# Patient Record
Sex: Female | Born: 1980 | Race: Black or African American | Hispanic: No | Marital: Single | Smoking: Former smoker
Health system: Southern US, Community
[De-identification: ages and names within clinical notes are randomized; demographics above are authoritative.]

---

## 2002-12-20 DIAGNOSIS — F419 Anxiety disorder, unspecified: Secondary | ICD-10-CM

## 2002-12-20 HISTORY — DX: Anxiety disorder, unspecified: F41.9

## 2003-12-21 HISTORY — PX: HERNIA REPAIR: SHX51

## 2007-12-21 DIAGNOSIS — K219 Gastro-esophageal reflux disease without esophagitis: Secondary | ICD-10-CM

## 2007-12-21 HISTORY — DX: Gastro-esophageal reflux disease without esophagitis: K21.9

## 2011-12-21 DIAGNOSIS — J45909 Unspecified asthma, uncomplicated: Secondary | ICD-10-CM

## 2011-12-21 HISTORY — DX: Unspecified asthma, uncomplicated: J45.909

## 2018-06-19 DIAGNOSIS — R079 Chest pain, unspecified: Secondary | ICD-10-CM | POA: Diagnosis not present

## 2018-06-19 DIAGNOSIS — R300 Dysuria: Secondary | ICD-10-CM | POA: Diagnosis not present

## 2018-06-19 DIAGNOSIS — R109 Unspecified abdominal pain: Secondary | ICD-10-CM | POA: Diagnosis not present

## 2018-06-19 DIAGNOSIS — M5489 Other dorsalgia: Secondary | ICD-10-CM | POA: Diagnosis not present

## 2018-06-19 DIAGNOSIS — R1084 Generalized abdominal pain: Secondary | ICD-10-CM | POA: Diagnosis not present

## 2018-06-20 DIAGNOSIS — R300 Dysuria: Secondary | ICD-10-CM | POA: Diagnosis not present

## 2018-06-20 DIAGNOSIS — M5489 Other dorsalgia: Secondary | ICD-10-CM | POA: Diagnosis not present

## 2018-06-20 DIAGNOSIS — R109 Unspecified abdominal pain: Secondary | ICD-10-CM | POA: Diagnosis not present

## 2018-06-20 DIAGNOSIS — R1084 Generalized abdominal pain: Secondary | ICD-10-CM | POA: Diagnosis not present

## 2018-11-10 DIAGNOSIS — J45901 Unspecified asthma with (acute) exacerbation: Secondary | ICD-10-CM | POA: Diagnosis not present

## 2018-11-10 DIAGNOSIS — J020 Streptococcal pharyngitis: Secondary | ICD-10-CM | POA: Diagnosis not present

## 2018-12-07 DIAGNOSIS — Z0000 Encounter for general adult medical examination without abnormal findings: Secondary | ICD-10-CM | POA: Diagnosis not present

## 2018-12-07 DIAGNOSIS — Z113 Encounter for screening for infections with a predominantly sexual mode of transmission: Secondary | ICD-10-CM | POA: Diagnosis not present

## 2018-12-07 DIAGNOSIS — Z13220 Encounter for screening for lipoid disorders: Secondary | ICD-10-CM | POA: Diagnosis not present

## 2018-12-07 DIAGNOSIS — Z114 Encounter for screening for human immunodeficiency virus [HIV]: Secondary | ICD-10-CM | POA: Diagnosis not present

## 2018-12-07 DIAGNOSIS — Z131 Encounter for screening for diabetes mellitus: Secondary | ICD-10-CM | POA: Diagnosis not present

## 2018-12-07 DIAGNOSIS — J45909 Unspecified asthma, uncomplicated: Secondary | ICD-10-CM | POA: Diagnosis not present

## 2018-12-07 DIAGNOSIS — Z1329 Encounter for screening for other suspected endocrine disorder: Secondary | ICD-10-CM | POA: Diagnosis not present

## 2018-12-07 DIAGNOSIS — R5383 Other fatigue: Secondary | ICD-10-CM | POA: Diagnosis not present

## 2018-12-20 DIAGNOSIS — I10 Essential (primary) hypertension: Secondary | ICD-10-CM

## 2018-12-20 DIAGNOSIS — E7800 Pure hypercholesterolemia, unspecified: Secondary | ICD-10-CM

## 2018-12-20 HISTORY — DX: Essential (primary) hypertension: I10

## 2018-12-20 HISTORY — DX: Pure hypercholesterolemia, unspecified: E78.00

## 2019-01-24 DIAGNOSIS — Z01419 Encounter for gynecological examination (general) (routine) without abnormal findings: Secondary | ICD-10-CM | POA: Diagnosis not present

## 2019-01-24 DIAGNOSIS — Z113 Encounter for screening for infections with a predominantly sexual mode of transmission: Secondary | ICD-10-CM | POA: Diagnosis not present

## 2019-02-26 DIAGNOSIS — J209 Acute bronchitis, unspecified: Secondary | ICD-10-CM | POA: Diagnosis not present

## 2019-02-26 DIAGNOSIS — Z79899 Other long term (current) drug therapy: Secondary | ICD-10-CM | POA: Diagnosis not present

## 2019-02-26 DIAGNOSIS — R05 Cough: Secondary | ICD-10-CM | POA: Diagnosis not present

## 2019-02-26 DIAGNOSIS — J45909 Unspecified asthma, uncomplicated: Secondary | ICD-10-CM | POA: Diagnosis not present

## 2019-05-18 DIAGNOSIS — B373 Candidiasis of vulva and vagina: Secondary | ICD-10-CM | POA: Diagnosis not present

## 2019-05-18 DIAGNOSIS — Z113 Encounter for screening for infections with a predominantly sexual mode of transmission: Secondary | ICD-10-CM | POA: Diagnosis not present

## 2019-05-18 DIAGNOSIS — N760 Acute vaginitis: Secondary | ICD-10-CM | POA: Diagnosis not present

## 2019-05-29 DIAGNOSIS — H52209 Unspecified astigmatism, unspecified eye: Secondary | ICD-10-CM | POA: Diagnosis not present

## 2019-05-29 DIAGNOSIS — H5213 Myopia, bilateral: Secondary | ICD-10-CM | POA: Diagnosis not present

## 2019-06-13 DIAGNOSIS — H52209 Unspecified astigmatism, unspecified eye: Secondary | ICD-10-CM | POA: Diagnosis not present

## 2019-07-06 DIAGNOSIS — Z111 Encounter for screening for respiratory tuberculosis: Secondary | ICD-10-CM | POA: Diagnosis not present

## 2019-08-13 DIAGNOSIS — J45901 Unspecified asthma with (acute) exacerbation: Secondary | ICD-10-CM | POA: Diagnosis not present

## 2019-08-13 DIAGNOSIS — R509 Fever, unspecified: Secondary | ICD-10-CM | POA: Diagnosis not present

## 2019-08-17 DIAGNOSIS — R918 Other nonspecific abnormal finding of lung field: Secondary | ICD-10-CM | POA: Diagnosis not present

## 2019-08-17 DIAGNOSIS — R197 Diarrhea, unspecified: Secondary | ICD-10-CM | POA: Diagnosis not present

## 2019-08-17 DIAGNOSIS — B349 Viral infection, unspecified: Secondary | ICD-10-CM | POA: Diagnosis not present

## 2019-08-17 DIAGNOSIS — R1110 Vomiting, unspecified: Secondary | ICD-10-CM | POA: Diagnosis not present

## 2019-08-17 DIAGNOSIS — R42 Dizziness and giddiness: Secondary | ICD-10-CM | POA: Diagnosis not present

## 2019-08-17 DIAGNOSIS — R531 Weakness: Secondary | ICD-10-CM | POA: Diagnosis not present

## 2019-08-17 DIAGNOSIS — Z5329 Procedure and treatment not carried out because of patient's decision for other reasons: Secondary | ICD-10-CM | POA: Diagnosis not present

## 2019-08-17 DIAGNOSIS — Z20828 Contact with and (suspected) exposure to other viral communicable diseases: Secondary | ICD-10-CM | POA: Diagnosis not present

## 2019-08-17 DIAGNOSIS — U071 COVID-19: Secondary | ICD-10-CM | POA: Diagnosis not present

## 2019-08-17 DIAGNOSIS — R05 Cough: Secondary | ICD-10-CM | POA: Diagnosis not present

## 2019-12-06 DIAGNOSIS — Z23 Encounter for immunization: Secondary | ICD-10-CM | POA: Diagnosis not present

## 2019-12-06 DIAGNOSIS — E785 Hyperlipidemia, unspecified: Secondary | ICD-10-CM | POA: Diagnosis not present

## 2019-12-06 DIAGNOSIS — Z131 Encounter for screening for diabetes mellitus: Secondary | ICD-10-CM | POA: Diagnosis not present

## 2019-12-06 DIAGNOSIS — Z8619 Personal history of other infectious and parasitic diseases: Secondary | ICD-10-CM | POA: Diagnosis not present

## 2019-12-06 DIAGNOSIS — Z8639 Personal history of other endocrine, nutritional and metabolic disease: Secondary | ICD-10-CM | POA: Diagnosis not present

## 2019-12-06 DIAGNOSIS — Z9189 Other specified personal risk factors, not elsewhere classified: Secondary | ICD-10-CM | POA: Diagnosis not present

## 2019-12-06 DIAGNOSIS — D649 Anemia, unspecified: Secondary | ICD-10-CM | POA: Diagnosis not present

## 2019-12-06 DIAGNOSIS — R300 Dysuria: Secondary | ICD-10-CM | POA: Diagnosis not present

## 2019-12-06 DIAGNOSIS — Z13220 Encounter for screening for lipoid disorders: Secondary | ICD-10-CM | POA: Diagnosis not present

## 2020-07-02 ENCOUNTER — Ambulatory Visit: Payer: Self-pay | Admitting: Family Medicine

## 2020-07-15 NOTE — Progress Notes (Signed)
New Patient Visit Subjective:  CC: Establish care  HPI Tamara Mccarthy is a 39 y.o. female who presents today to establish care. We reviewed her active problems as below.   Vaginal discharge Patient denies any dysuria.  Reports vaginal discharge and odor.  Not currently sexually active.  No known medication allergies.  Asthma  Does not use inhaler daily or weekly. Usually worse in the spring with pollen or changes in season/weather. Well controlled.   Anemia  No dizziness, tachycardia, syncope.   Vit D Def Took supplementation as prescribed from her doctor but never followed up   Hyperlipidemia  Was rx'ed rosuvastatin but stopped taking it and wanted to try lifestyle changes   HSV  Having monthly breakouts around the time of cycle. Was previously on PRN therapy.   HISTORY Reviewed with patient and updated in EMR as appropriate.  No Known Allergies  Current Outpatient Medications:  .  albuterol (VENTOLIN HFA) 108 (90 Base) MCG/ACT inhaler, Inhale into the lungs., Disp: , Rfl:  .  ergocalciferol (VITAMIN D2) 1.25 MG (50000 UT) capsule, Take by mouth., Disp: , Rfl:  .  rosuvastatin (CRESTOR) 5 MG tablet, Take by mouth., Disp: , Rfl:  .  fluticasone (FLONASE) 50 MCG/ACT nasal spray, Place 1 spray into both nostrils daily., Disp: , Rfl:  .  metroNIDAZOLE (FLAGYL) 500 MG tablet, Take 1 tablet (500 mg total) by mouth 2 (two) times daily for 7 days., Disp: 14 tablet, Rfl: 0 .  valACYclovir (VALTREX) 500 MG tablet, Take 1 tablet (500 mg total) by mouth daily., Disp: 90 tablet, Rfl: 3  Past Medical History:  Diagnosis Date  . Acid reflux 2009  . Anxiety 2004  . Asthma 2013  . High cholesterol 2020  . Hypertension 2020   Past Surgical History:  Procedure Laterality Date  . CESAREAN SECTION  2002  . CESAREAN SECTION  2008  . CESAREAN SECTION  2011  . CESAREAN SECTION  2013  . HERNIA REPAIR  [place], [place]   OB/Gyn History:   Periods regular with heavy bleeding,  for about 7 days with 2/7 days with heavy bleeding.  LMP: Patient's last menstrual period was 07/03/2020 (approximate). 4 cesarean section. S/p tubal ligation.   Health Maintenance:  Pap smear today STD screening today  Hep C   Eye doctor: has an appointment next month.  Dentist: Does not have one here yet.    Family History Tamara Mccarthy's family history includes Alcohol abuse in her father and mother; Depression in her father, maternal grandfather, maternal grandmother, and mother; Drug abuse in her father and mother; Hypertension in her mother.   Social History  Tamara Mccarthy's  reports that she quit smoking about 17 years ago. Her smoking use included cigarettes and e-cigarettes. She has never used smokeless tobacco. She reports that she does not drink alcohol and does not use drugs..  Social History   Social History Narrative   Hobbies: writing poems, cooking, braiding hair, going to [employer] and the movies.Lives with three boys (12, 9, 8). Also has a daughter. Not working right now.  Moved to [place] for a change from [place], [place]     Objective:  Physical Exam:  BP 112/78   Pulse 95   Ht 5' 4.5" (1.638 m)   Wt (!) 240 lb 9.6 oz (109.1 kg)   LMP 07/03/2020 (Approximate)   SpO2 99%   BMI 40.66 kg/m   Gen: NAD, alert, non-toxic, pleasant Skin: No obvious rashes, lesions, or trauma.  Normal turgor.  MSK: Normal ROM. Normal strength and tone.  Neuro: Cranial nerves II through VI grossly intact. Gait normal.  Alert and oriented x4.  No obvious abnormal movements. Psych: Cooperative with exam.  Normal speech. Pleasant. Makes good eye contact. Genitourinary: GU: External vulva and vagina nonerythematous, without any obvious lesions or rash.  no abnormal discharge appreciated.Normal ruggae of vaginal walls.  Cervix is non erythematous and non-friable.  There is no cervical motion tenderness, masses or gross abnormalities appreciated during bimanual exam.    Assessment & Plan:  Vitamin D  deficiency Checking levels today  Anemia Patient reports heavy periods.  Recent iron panel with low iron.  Will check ferritin today and start iron therapy as needed.  Otherwise asymptomatic.  Herpes simplex virus (HSV) infection Recently recurrent monthly outbreaks.  Will start patient on prophylactic therapy.  500 mg once daily.  Asthma Well controlled. Continue to monitor   BMI 40.0-44.9, adult (HCC) Previously dx'ed with HLD and HTN. No HTN today. Will obtain lipid panel   Vaginal discharge Vaginal discharge with odor today.  Obtain wet prep.  Positive for bacterial vaginosis.  Sending metronidazole to the pharmacy.  Will call patient with result  Follow up:  Future Appointments  Date Time Provider Department Center  08/08/2020  9:50 AM Melene Plan, MD [facility]    Melene Plan, MD 07/16/20, 2:55 PM

## 2020-07-16 ENCOUNTER — Other Ambulatory Visit (HOSPITAL_COMMUNITY)
Admission: RE | Admit: 2020-07-16 | Discharge: 2020-07-16 | Disposition: A | Discharge status: Home / Self Care | Payer: Medicaid Other | Source: Ambulatory Visit | Attending: Family Medicine | Admitting: Family Medicine

## 2020-07-16 ENCOUNTER — Ambulatory Visit (INDEPENDENT_AMBULATORY_CARE_PROVIDER_SITE_OTHER): Payer: Medicaid Other | Admitting: Family Medicine

## 2020-07-16 ENCOUNTER — Encounter: Payer: Self-pay | Admitting: Family Medicine

## 2020-07-16 ENCOUNTER — Other Ambulatory Visit: Payer: Self-pay

## 2020-07-16 VITALS — BP 112/78 | HR 95 | Ht 64.5 in | Wt 240.6 lb

## 2020-07-16 DIAGNOSIS — B559 Leishmaniasis, unspecified: Secondary | ICD-10-CM | POA: Diagnosis not present

## 2020-07-16 DIAGNOSIS — Z0000 Encounter for general adult medical examination without abnormal findings: Secondary | ICD-10-CM | POA: Diagnosis not present

## 2020-07-16 DIAGNOSIS — N898 Other specified noninflammatory disorders of vagina: Secondary | ICD-10-CM

## 2020-07-16 DIAGNOSIS — Z6841 Body Mass Index (BMI) 40.0 and over, adult: Secondary | ICD-10-CM

## 2020-07-16 DIAGNOSIS — Z01419 Encounter for gynecological examination (general) (routine) without abnormal findings: Secondary | ICD-10-CM | POA: Insufficient documentation

## 2020-07-16 DIAGNOSIS — B009 Herpesviral infection, unspecified: Secondary | ICD-10-CM | POA: Diagnosis not present

## 2020-07-16 DIAGNOSIS — J45909 Unspecified asthma, uncomplicated: Secondary | ICD-10-CM | POA: Insufficient documentation

## 2020-07-16 DIAGNOSIS — J4520 Mild intermittent asthma, uncomplicated: Secondary | ICD-10-CM | POA: Diagnosis not present

## 2020-07-16 DIAGNOSIS — D649 Anemia, unspecified: Secondary | ICD-10-CM

## 2020-07-16 DIAGNOSIS — E559 Vitamin D deficiency, unspecified: Secondary | ICD-10-CM

## 2020-07-16 HISTORY — DX: Body Mass Index (BMI) 40.0 and over, adult: Z684

## 2020-07-16 LAB — POCT WET PREP (WET MOUNT)
Clue Cells Wet Prep Whiff POC: POSITIVE
Trichomonas Wet Prep HPF POC: ABSENT

## 2020-07-16 MED ORDER — VALACYCLOVIR HCL 500 MG PO TABS: 500.0000 mg | ORAL_TABLET | Freq: Every day | ORAL | 3 refills | Status: AC

## 2020-07-16 MED ORDER — METRONIDAZOLE 500 MG PO TABS: 500.0000 mg | ORAL_TABLET | Freq: Two times a day (BID) | ORAL | 0 refills | Status: AC

## 2020-07-16 NOTE — Assessment & Plan Note (Signed)
Previously dx'ed with HLD and HTN. No HTN today. Will obtain lipid panel

## 2020-07-16 NOTE — Assessment & Plan Note (Signed)
Well-controlled. Continue to monitor. 

## 2020-07-16 NOTE — Assessment & Plan Note (Signed)
Patient reports heavy periods.  Recent iron panel with low iron.  Will check ferritin today and start iron therapy as needed.  Otherwise asymptomatic.

## 2020-07-16 NOTE — Assessment & Plan Note (Signed)
Recently recurrent monthly outbreaks.  Will start patient on prophylactic therapy.  500 mg once daily.

## 2020-07-16 NOTE — Assessment & Plan Note (Addendum)
Vaginal discharge with odor today.  Obtain wet prep.  Positive for bacterial vaginosis.  Sending metronidazole to the pharmacy.  Will call patient with result

## 2020-07-16 NOTE — Assessment & Plan Note (Signed)
Checking levels today

## 2020-07-16 NOTE — Patient Instructions (Signed)
It was really nice to meet you! I look forward to taking care of you. I will call you with results. Please sign up for MyChart. Lets plan on making a follow up (at your earliest convenience) visit to review your labs and other health maintenance issues.

## 2020-07-17 LAB — CYTOLOGY - PAP
Chlamydia: NEGATIVE
Comment: NEGATIVE
Comment: NEGATIVE
Comment: NORMAL
Diagnosis: NEGATIVE
High risk HPV: NEGATIVE
Neisseria Gonorrhea: NEGATIVE

## 2020-07-17 LAB — CBC
Hematocrit: 36.5 % (ref 34.0–46.6)
Hemoglobin: 11.3 g/dL (ref 11.1–15.9)
MCH: 23.6 pg — ABNORMAL LOW (ref 26.6–33.0)
MCHC: 31 g/dL — ABNORMAL LOW (ref 31.5–35.7)
MCV: 76 fL — ABNORMAL LOW (ref 79–97)
Platelets: 458 10*3/uL — ABNORMAL HIGH (ref 150–450)
RBC: 4.79 x10E6/uL (ref 3.77–5.28)
RDW: 14.6 % (ref 11.7–15.4)
WBC: 6.8 10*3/uL (ref 3.4–10.8)

## 2020-07-17 LAB — LIPID PANEL
Chol/HDL Ratio: 4.3 ratio (ref 0.0–4.4)
Cholesterol, Total: 201 mg/dL — ABNORMAL HIGH (ref 100–199)
HDL: 47 mg/dL (ref >39)
LDL Chol Calc (NIH): 118 mg/dL — ABNORMAL HIGH (ref 0–99)
Triglycerides: 208 mg/dL — ABNORMAL HIGH (ref 0–149)
VLDL Cholesterol Cal: 36 mg/dL (ref 5–40)

## 2020-07-17 LAB — VITAMIN D 25 HYDROXY (VIT D DEFICIENCY, FRACTURES): Vit D, 25-Hydroxy: 15.5 ng/mL — ABNORMAL LOW (ref 30.0–100.0)

## 2020-07-17 LAB — HEPATITIS C ANTIBODY: Hep C Virus Ab: <0.1 s/co ratio (ref 0.0–0.9)

## 2020-07-17 LAB — FERRITIN: Ferritin: 8 ng/mL — ABNORMAL LOW (ref 15–150)

## 2020-07-18 ENCOUNTER — Ambulatory Visit: Payer: Self-pay | Admitting: Family Medicine

## 2020-08-08 ENCOUNTER — Ambulatory Visit: Payer: Medicaid Other | Admitting: Family Medicine

## 2020-08-08 ENCOUNTER — Encounter: Payer: Self-pay | Admitting: Family Medicine

## 2020-08-08 ENCOUNTER — Other Ambulatory Visit: Payer: Self-pay

## 2020-08-08 VITALS — BP 118/78 | HR 97 | Ht 65.0 in | Wt 238.4 lb

## 2020-08-08 DIAGNOSIS — R251 Tremor, unspecified: Secondary | ICD-10-CM | POA: Diagnosis not present

## 2020-08-08 DIAGNOSIS — Z23 Encounter for immunization: Secondary | ICD-10-CM | POA: Diagnosis not present

## 2020-08-08 DIAGNOSIS — Z114 Encounter for screening for human immunodeficiency virus [HIV]: Secondary | ICD-10-CM

## 2020-08-08 DIAGNOSIS — F419 Anxiety disorder, unspecified: Secondary | ICD-10-CM | POA: Diagnosis not present

## 2020-08-08 DIAGNOSIS — N898 Other specified noninflammatory disorders of vagina: Secondary | ICD-10-CM

## 2020-08-08 MED ORDER — CLINDAMYCIN PHOSPHATE 100 MG VA SUPP: 100.0000 mg | Freq: Every day | VAGINAL | 0 refills | Status: DC

## 2020-08-08 MED ORDER — FLUOXETINE HCL 20 MG PO CAPS
ORAL_CAPSULE | ORAL | 0 refills | Status: DC
Start: 2020-08-08 — End: 2020-09-04

## 2020-08-08 NOTE — Progress Notes (Signed)
    SUBJECTIVE:   CHIEF COMPLAINT / HPI:   Shaking/Tremors Anxiety Hx of anxiety from 2004-2007. She noticed jerking in her body when driving and a week ago noticed jerking in her sleep. She reports generalized shaking of her whole body. She does not lose consciousness. Sometimes when she feels like that when sleeping, its hard to wake up. It is occurring most nights a week. This past weekend, was with out with family and was trying to "hold it" to hide it. She reports that this did not have with panic attacks and anxiety previously. With sleep, the shaking occurs in the middle of the night which wakes her up. Episodes have been the same amount of time, seconds to a minute. When it is done, she feels tingling in her hands and arm. She can feel it coming on. Does not feel worried during these times when episodes occur. Feels anxiety with news. Currently living in a shelter.  Gad 7 - 5 PHQ9 - 0   Metronidazole Rash  Patient diagnosed with BV at last visit on 07/16/20. Patient reports rash with abx. Requesting different medication. Continues to be symptomatic.   Covid Vx  Patient requesting covid vx today.   PERTINENT  PMH / PSH: BMI, HSV, anemia, BMI 40  OBJECTIVE:   BP 118/78   Pulse 97   Ht 5\' 5"  (1.651 m)   Wt 238 lb 6.4 oz (108.1 kg)   SpO2 97%   BMI 39.67 kg/m   General: Well-appearing female, no acute distress. Neuro: No focal neurologic deficits.  Cranial nerves intact.  No tremors or shaking during exam today.  ASSESSMENT/PLAN:   Episode of shaking Unclear etiology of shaking.  May be due to anxiety.  No focal neurologic deficits on exam today and seems more so voluntary as patient can control shaking episodes.  We will continue to monitor and have recommended that patient start symptom journal to help with diagnosis.  Patient does report anxiety from her current living situation and from Covid.  Anxiety Patient's anxiety increased with COVID-19 and currently living in a  shelter.  Her GAD-7 today is 5.  She would like to be started on a medication for antianxiety.  We will start Prozac 20 mg and increase to 40 mg after 1 week.  Patient to follow-up in 1 to 2 weeks for follow-up.  Also provided patient list of resources for counseling and therapy.  Vaginal discharge Patient has had continued vaginal discharge and symptoms since 07/16/2020.  She reports metronidazole caused her to have a rash.  We will send clindamycin suppositories to the pharmacy.     07/18/2020, MD [facility]

## 2020-08-08 NOTE — Patient Instructions (Addendum)
Anxiety Please see the list of contacts below for counseling services. We will also start a medication called Prozac (aka fluoxetine). Start with 20 mg for one week and then increase to 40 mg. Come back in 1-2 weeks to follow up for anxiety and for shaking episodes. Please make a journal of the shaking episodes. Please go to the emergency department if you experience any loss of consciousness, slurred speech, weakness during the episodes.   Please use the clindamycin suppositories for three days for BV infection   Therapy and Counseling Resources Most providers on this list will take Medicaid. Patients with commercial insurance or Medicare should contact their insurance company to get a list of in network providers.  [facility]  [address] [place], [place] [address]      [phone]  [facility] [address]  [place], [place] [address] [phone]  [facility] [address]  [place], [place] [address]       [phone]      Jovita Kussmaul [facility] [address], [place], [place] [phone]  [facility]:  [address] [place] [phone]  [facility]:  [address] [place] [place] [phone]  [facility] (under & uninsured) [address]   [place] [phone]    [email or web address]    [facility] [address] . [place]    [phone]  [facility] -[address]     Phone:  [phone]     [facility]-  [address]  [phone]   [facility] [address]      [place], [place] [phone] ext [phone]  [facility]: [address] [place], [place], [place]  [phone]   [facility] (Spanish therapist) [address] [place]  [address]   [place] [address]    [phone]    [facility]   [address],  [place], [place]  [phone]   [facility]  [address] [place]  [phone]  [facility]  [address] [place], [place]        [phone]  Open Access/Walk In Clinic under & uninsured  [facility]  [address] [place], [place] [phone] Crisis [phone]  [facility] [place],  (Spanish)   [address], [place]: [phone]) 8:30 - 12; 1 - 2:30  [facility] [employer],  [address] [place], [place]    ([phone]):8:30 - 12; 2 - 3PM  [facility] [employer],  [address]  [place]; [phone]):   Mon - Fri 8 AM - 5 PM  [facility] [address] [place]  MWF 12:30 to 3:00 or call to schedule an appointment  [phone]  Specific Provider options Psychology Today  [email or web address] 1. click on find a therapist  2. enter your zip code 3. left side and select or tailor a therapist for your specific need.   [facility] Provider Directory [email or web address]  (Medicaid)   Follow all drop down to find a provider  Social Support program [facility] [place] [phone] or [employer] [address], [place], [place] Recovery support and educational   24- Hour Availability:  .  Marland Kitchen [facility]  . [address] [place], [place] [phone] Crisis [phone]  . [facility] [phone]  [facility] Crisis Service  [phone]   . [facility] [employer]  [phone] (after hours)  . Therapeutic Alternative/Mobile Crisis   [phone]  . [place] National Suicide Hotline  850-483-9008 (TALK)  . Call 911 or go to emergency room  . [employer]  [phone]);  [place] and [employer]   . [facility]  [phone]); [place], [place],  [place], [place], [place], [place], [place]

## 2020-08-09 LAB — BASIC METABOLIC PANEL
BUN/Creatinine Ratio: 11 (ref 9–23)
BUN: 8 mg/dL (ref 6–20)
CO2: 23 mmol/L (ref 20–29)
Calcium: 9 mg/dL (ref 8.7–10.2)
Chloride: 102 mmol/L (ref 96–106)
Creatinine, Ser: 0.71 mg/dL (ref 0.57–1.00)
GFR calc Af Amer: 125 mL/min/{1.73_m2} (ref >59)
GFR calc non Af Amer: 108 mL/min/{1.73_m2} (ref >59)
Glucose: 80 mg/dL (ref 65–99)
Potassium: 3.9 mmol/L (ref 3.5–5.2)
Sodium: 137 mmol/L (ref 134–144)

## 2020-08-09 LAB — HIV ANTIBODY (ROUTINE TESTING W REFLEX): HIV Screen 4th Generation wRfx: NONREACTIVE

## 2020-08-11 DIAGNOSIS — Z23 Encounter for immunization: Secondary | ICD-10-CM | POA: Insufficient documentation

## 2020-08-11 DIAGNOSIS — F419 Anxiety disorder, unspecified: Secondary | ICD-10-CM | POA: Insufficient documentation

## 2020-08-11 DIAGNOSIS — R251 Tremor, unspecified: Secondary | ICD-10-CM | POA: Insufficient documentation

## 2020-08-11 NOTE — Assessment & Plan Note (Signed)
Patient has had continued vaginal discharge and symptoms since 07/16/2020.  She reports metronidazole caused her to have a rash.  We will send clindamycin suppositories to the pharmacy.

## 2020-08-11 NOTE — Assessment & Plan Note (Addendum)
Unclear etiology of shaking.  May be due to anxiety.  No focal neurologic deficits on exam today and seems more so voluntary as patient can control shaking episodes.  We will continue to monitor and have recommended that patient start symptom journal to help with diagnosis.  Patient does report anxiety from her current living situation and from Covid. Also checked BMP which was normal.

## 2020-08-11 NOTE — Assessment & Plan Note (Signed)
Patient's anxiety increased with COVID-19 and currently living in a shelter.  Her GAD-7 today is 5.  She would like to be started on a medication for antianxiety.  We will start Prozac 20 mg and increase to 40 mg after 1 week.  Patient to follow-up in 1 to 2 weeks for follow-up.  Also provided patient list of resources for counseling and therapy.

## 2020-08-12 ENCOUNTER — Encounter: Payer: Self-pay | Admitting: Family Medicine

## 2020-08-13 ENCOUNTER — Encounter: Payer: Self-pay | Admitting: Family Medicine

## 2020-08-13 MED ORDER — ERGOCALCIFEROL 1.25 MG (50000 UT) PO CAPS: 50000.0000 [IU] | ORAL_CAPSULE | ORAL | 1 refills | Status: DC

## 2020-08-13 MED ORDER — ROSUVASTATIN CALCIUM 20 MG PO TABS
20.0000 mg | ORAL_TABLET | Freq: Every day | ORAL | 3 refills | Status: DC
Start: 2020-08-13 — End: 2021-07-21

## 2020-08-13 NOTE — Telephone Encounter (Signed)
Spoke to patient over the phone to answer her MyChart question.  Refilled vitamin D 50,000 units once weekly for 8 weeks.  Will recheck in 8 weeks. Also increased rosuvastatin to 20 mg from 5 mg given her elevated cholesterol on lipid panel.  We will recheck this in 3 months.

## 2020-08-18 ENCOUNTER — Encounter: Payer: Self-pay | Admitting: Family Medicine

## 2020-08-18 DIAGNOSIS — N920 Excessive and frequent menstruation with regular cycle: Secondary | ICD-10-CM

## 2020-08-18 NOTE — Telephone Encounter (Signed)
Spoke to patient over the phone who reported that she was having some dizziness and lightheadedness.  At her last visit on 07/16/2020, her hemoglobin was 11.3.  Patient denies any history of transfusions. Discussed reasons to go to the emergency department.  At this time, patient would like to come into the office for a lab visit to get her blood levels checked.  Will schedule patient for lab visit tomorrow at 1:30 and will place order for future point-of-care hemoglobin.  Melene Plan, M.D.  4:46 PM 08/18/2020

## 2020-08-19 ENCOUNTER — Other Ambulatory Visit: Payer: Medicaid Other

## 2020-08-20 ENCOUNTER — Ambulatory Visit (INDEPENDENT_AMBULATORY_CARE_PROVIDER_SITE_OTHER): Payer: Medicaid Other | Admitting: Family Medicine

## 2020-08-20 ENCOUNTER — Other Ambulatory Visit: Payer: Self-pay

## 2020-08-20 ENCOUNTER — Ambulatory Visit (HOSPITAL_COMMUNITY)
Admission: EM | Admit: 2020-08-20 | Discharge: 2020-08-20 | Discharge status: Against Medical Advice | Payer: Medicaid Other

## 2020-08-20 ENCOUNTER — Encounter: Payer: Self-pay | Admitting: Family Medicine

## 2020-08-20 ENCOUNTER — Other Ambulatory Visit (INDEPENDENT_AMBULATORY_CARE_PROVIDER_SITE_OTHER): Payer: Medicaid Other

## 2020-08-20 VITALS — BP 104/70 | HR 95 | Wt 238.0 lb

## 2020-08-20 DIAGNOSIS — N920 Excessive and frequent menstruation with regular cycle: Secondary | ICD-10-CM

## 2020-08-20 DIAGNOSIS — N939 Abnormal uterine and vaginal bleeding, unspecified: Secondary | ICD-10-CM

## 2020-08-20 LAB — POCT HEMOGLOBIN: Hemoglobin: 9.7 g/dL — AB (ref 11–14.6)

## 2020-08-20 LAB — POCT URINE PREGNANCY: Preg Test, Ur: NEGATIVE

## 2020-08-20 MED ORDER — FERUMOXYTOL INJECTION 510 MG/17 ML: 510.0000 mg | Freq: Once | INTRAVENOUS | 0 refills | Status: DC

## 2020-08-20 MED ORDER — MEGESTROL ACETATE 40 MG PO TABS: ORAL_TABLET | ORAL | 0 refills | Status: DC

## 2020-08-20 NOTE — Progress Notes (Signed)
    SUBJECTIVE:   CHIEF COMPLAINT / HPI:   Vaginal bleeding: 2.5 weeks of heavy bleeding with clots.  Feels like her ovaries are "cramping sharp pain ".  Pain is an 8/10.  Normally has regular vaginal bleeding during her cycles with 7 days total in 2 days of heavy bleeding.Marland Kitchen  Has a distant hx of ovarian cysts that were diagnosed when she was still living in [facility] by her OB/GYN. Not on birth control currently since her tubal ligation.  Not currently sexually active since January.  Not currently taking iron.    PERTINENT  PMH / PSH: Anemia  OBJECTIVE:   BP 104/70   Pulse 95   Wt 238 lb (108 kg)   LMP 08/06/2020   SpO2 98%   BMI 39.61 kg/m   General: Alert, oriented. CV: Regular rate and rhythm GI: Mild tenderness in the lower quadrants bilaterally. GU: Significant bleeding in the vaginal vault coming from cervix.  No lesions seen on speculum exam.   ASSESSMENT/PLAN:   Abnormal uterine bleeding (AUB) Two and half weeks of daily vaginal bleeding.  Will prescribe Megace 80 mg three times a day for 3 days followed by 40 mg three times a day for a month.  We will then taper down at that point after she sees [place] again in clinic.  Will get transvaginal ultrasound to look for structural causes.  CBC and point-of-care pregnancy test.  Will order Feraheme.     Sandre Kitty, MD [facility]

## 2020-08-20 NOTE — Patient Instructions (Signed)
It was nice to meet you today,  We did the following things today: -I have started a medicine called Megace.  I want you to take 80 mg 3 times a day for 5 days and then reduce this to 40 mg 3 times a day for 1 month total.  I have written you prescription for enough pills to last you for 1 month. -I would like you to schedule appointment with me in 2 weeks and then another appointment with your PCP, Dr. Selena Batten for 1 month from now approximately. -I have ordered an ultrasound for your uterus to look for fibroids. -I have ordered IV iron which you will get at an infusion center -I have ordered a CBC to confirm the hemoglobin from earlier today.  If you have any difficulty as well taking this medicine, please call our office to let us know before stopping it.  Have a great day,  Frederic Jericho, MD

## 2020-08-21 LAB — CBC
Hematocrit: 34.9 % (ref 34.0–46.6)
Hemoglobin: 10.6 g/dL — ABNORMAL LOW (ref 11.1–15.9)
MCH: 23.3 pg — ABNORMAL LOW (ref 26.6–33.0)
MCHC: 30.4 g/dL — ABNORMAL LOW (ref 31.5–35.7)
MCV: 77 fL — ABNORMAL LOW (ref 79–97)
Platelets: 443 10*3/uL (ref 150–450)
RBC: 4.54 x10E6/uL (ref 3.77–5.28)
RDW: 14.8 % (ref 11.7–15.4)
WBC: 8.6 10*3/uL (ref 3.4–10.8)

## 2020-08-22 ENCOUNTER — Telehealth: Payer: Self-pay | Admitting: Family Medicine

## 2020-08-22 NOTE — Telephone Encounter (Signed)
Patient is calling and would like to know the status of having an order placed for IV iron. I saw in the notes it said it would be sent to an infusion center.   Patient would like for someone to call her to give her information about scheduling with them. I informed her that the infusion center would most likely call to schedule after receiving the order.   Please call to discuss  [phone]

## 2020-08-23 DIAGNOSIS — N939 Abnormal uterine and vaginal bleeding, unspecified: Secondary | ICD-10-CM | POA: Insufficient documentation

## 2020-08-23 NOTE — Assessment & Plan Note (Signed)
Two and half weeks of daily vaginal bleeding.  Will prescribe Megace 80 mg three times a day for 3 days followed by 40 mg three times a day for a month.  We will then taper down at that point after she sees [place] again in clinic.  Will get transvaginal ultrasound to look for structural causes.  CBC and point-of-care pregnancy test.  Will order Feraheme.

## 2020-08-26 ENCOUNTER — Ambulatory Visit
Admission: RE | Admit: 2020-08-26 | Discharge: 2020-08-26 | Disposition: A | Discharge status: Home / Self Care | Payer: Medicaid Other | Source: Ambulatory Visit | Attending: Family Medicine | Admitting: Family Medicine

## 2020-08-26 DIAGNOSIS — N939 Abnormal uterine and vaginal bleeding, unspecified: Secondary | ICD-10-CM

## 2020-08-26 DIAGNOSIS — R9389 Abnormal findings on diagnostic imaging of other specified body structures: Secondary | ICD-10-CM | POA: Diagnosis not present

## 2020-08-26 DIAGNOSIS — N888 Other specified noninflammatory disorders of cervix uteri: Secondary | ICD-10-CM | POA: Diagnosis not present

## 2020-08-28 ENCOUNTER — Encounter: Payer: Self-pay | Admitting: Family Medicine

## 2020-08-29 ENCOUNTER — Ambulatory Visit: Payer: Medicaid Other

## 2020-08-29 ENCOUNTER — Other Ambulatory Visit: Payer: Medicaid Other

## 2020-08-29 ENCOUNTER — Other Ambulatory Visit: Payer: Self-pay

## 2020-08-29 VITALS — HR 94

## 2020-08-29 DIAGNOSIS — R059 Cough, unspecified: Secondary | ICD-10-CM

## 2020-08-29 MED ORDER — PREDNISONE 50 MG PO TABS: 50.0000 mg | ORAL_TABLET | Freq: Every day | ORAL | 0 refills | Status: DC

## 2020-08-29 MED ORDER — ALBUTEROL SULFATE HFA 108 (90 BASE) MCG/ACT IN AERS: 2.0000 | INHALATION_SPRAY | RESPIRATORY_TRACT | 0 refills | Status: DC | PRN

## 2020-08-29 NOTE — Progress Notes (Signed)
Patient presented today for her second COVID vaccine. Upon being roomed by RN for administration of COVID vaccine, patient reported cough and sore throat, and requested breathing treatment. RN contacted me as I was precepting, and under my instruction RN obtained pulse ox (96% on room air) and then asked patient to wait in her car.  I contacted patient by phone to get more history. She reports sore throat, cough, runny nose, and shortness of breath for one week. Denies fevers. Normal PO intake. No loss of taste or smell, no sick contacts. Has history of asthma and does report she has been wheezing. Notes similar symptoms in the past when the seasons change. In past has gotten neb treatments which have helped her symptoms. Does not have inhaler at home.  Patient speaking normally in full sentences throughout the encounter, without any respiratory distress evident.   Given extremely high prevalence of COVID (specifically delta variant) in our community at this time, strongly recommend patient be tested for COVID. Assisted her with scheduling an appointment this afternoon for testing. Advised isolation from others until test results return.  Rx sent in for albuterol inhaler as well as 5 day steroid burst. Counseled on signs/sx that should prompt her needing to go to the ER.   Patient will need to wait until she has tested negative prior to receiving second dose of COVID vaccine.  I inquired with patient whether she was asked about COVID symptoms at the front desk upon checkin, and she confirmed that she had been asked these questions, and had answered "no" despite having these symptoms. When I asked why she did not answer honestly, she stated she was under the impression that since she had COVID back in August of 2020 that she could not have it again. Informed patient that she can indeed get COVID a second time (especially this far out from primary infection), and that honesty in answering screening  questions is critical in order to limit exposure to all of the other patients in our clinic. Will route to Journalist, newspaper so they are aware.

## 2020-09-03 ENCOUNTER — Emergency Department (HOSPITAL_COMMUNITY)
Admission: EM | Admit: 2020-09-03 | Discharge: 2020-09-03 | Disposition: A | Discharge status: Home / Self Care | Payer: Medicaid Other | Attending: Emergency Medicine | Admitting: Emergency Medicine

## 2020-09-03 ENCOUNTER — Other Ambulatory Visit: Payer: Self-pay

## 2020-09-03 ENCOUNTER — Encounter (HOSPITAL_COMMUNITY): Payer: Self-pay

## 2020-09-03 ENCOUNTER — Other Ambulatory Visit: Payer: Self-pay | Admitting: Family Medicine

## 2020-09-03 ENCOUNTER — Telehealth: Payer: Self-pay | Admitting: *Deleted

## 2020-09-03 DIAGNOSIS — Z20822 Contact with and (suspected) exposure to covid-19: Secondary | ICD-10-CM | POA: Insufficient documentation

## 2020-09-03 DIAGNOSIS — B9789 Other viral agents as the cause of diseases classified elsewhere: Secondary | ICD-10-CM

## 2020-09-03 DIAGNOSIS — I10 Essential (primary) hypertension: Secondary | ICD-10-CM | POA: Insufficient documentation

## 2020-09-03 DIAGNOSIS — R4020 Unspecified coma: Secondary | ICD-10-CM | POA: Diagnosis not present

## 2020-09-03 DIAGNOSIS — Z79899 Other long term (current) drug therapy: Secondary | ICD-10-CM | POA: Diagnosis not present

## 2020-09-03 DIAGNOSIS — J45909 Unspecified asthma, uncomplicated: Secondary | ICD-10-CM | POA: Insufficient documentation

## 2020-09-03 DIAGNOSIS — Z7951 Long term (current) use of inhaled steroids: Secondary | ICD-10-CM | POA: Insufficient documentation

## 2020-09-03 DIAGNOSIS — D649 Anemia, unspecified: Secondary | ICD-10-CM | POA: Diagnosis present

## 2020-09-03 DIAGNOSIS — J329 Chronic sinusitis, unspecified: Secondary | ICD-10-CM

## 2020-09-03 DIAGNOSIS — R42 Dizziness and giddiness: Secondary | ICD-10-CM | POA: Diagnosis not present

## 2020-09-03 DIAGNOSIS — Z87891 Personal history of nicotine dependence: Secondary | ICD-10-CM | POA: Insufficient documentation

## 2020-09-03 DIAGNOSIS — H6593 Unspecified nonsuppurative otitis media, bilateral: Secondary | ICD-10-CM | POA: Insufficient documentation

## 2020-09-03 DIAGNOSIS — Z7952 Long term (current) use of systemic steroids: Secondary | ICD-10-CM | POA: Diagnosis not present

## 2020-09-03 DIAGNOSIS — J019 Acute sinusitis, unspecified: Secondary | ICD-10-CM | POA: Insufficient documentation

## 2020-09-03 DIAGNOSIS — R58 Hemorrhage, not elsewhere classified: Secondary | ICD-10-CM | POA: Diagnosis not present

## 2020-09-03 LAB — TYPE AND SCREEN
ABO/RH(D): O POS
Antibody Screen: NEGATIVE

## 2020-09-03 LAB — BASIC METABOLIC PANEL
Anion gap: 9 (ref 5–15)
BUN: 9 mg/dL (ref 6–20)
CO2: 25 mmol/L (ref 22–32)
Calcium: 8.9 mg/dL (ref 8.9–10.3)
Chloride: 104 mmol/L (ref 98–111)
Creatinine, Ser: 0.87 mg/dL (ref 0.44–1.00)
GFR calc Af Amer: >60 mL/min (ref >60)
GFR calc non Af Amer: >60 mL/min (ref >60)
Glucose, Bld: 97 mg/dL (ref 70–99)
Potassium: 3.6 mmol/L (ref 3.5–5.1)
Sodium: 138 mmol/L (ref 135–145)

## 2020-09-03 LAB — CBC
HCT: 39.1 % (ref 36.0–46.0)
Hemoglobin: 11.3 g/dL — ABNORMAL LOW (ref 12.0–15.0)
MCH: 22.9 pg — ABNORMAL LOW (ref 26.0–34.0)
MCHC: 28.9 g/dL — ABNORMAL LOW (ref 30.0–36.0)
MCV: 79.1 fL — ABNORMAL LOW (ref 80.0–100.0)
Platelets: 460 10*3/uL — ABNORMAL HIGH (ref 150–400)
RBC: 4.94 MIL/uL (ref 3.87–5.11)
RDW: 17.2 % — ABNORMAL HIGH (ref 11.5–15.5)
WBC: 12 10*3/uL — ABNORMAL HIGH (ref 4.0–10.5)
nRBC: 0 % (ref 0.0–0.2)

## 2020-09-03 LAB — I-STAT BETA HCG BLOOD, ED (MC, WL, AP ONLY): I-stat hCG, quantitative: <5 m[IU]/mL (ref <5)

## 2020-09-03 LAB — SARS CORONAVIRUS 2 BY RT PCR (HOSPITAL ORDER, PERFORMED IN ~~LOC~~ HOSPITAL LAB): SARS Coronavirus 2: NEGATIVE

## 2020-09-03 MED ORDER — GUAIFENESIN ER 600 MG PO TB12: 600.0000 mg | ORAL_TABLET | Freq: Two times a day (BID) | ORAL | 0 refills | Status: DC | PRN

## 2020-09-03 MED ORDER — FLUTICASONE PROPIONATE 50 MCG/ACT NA SUSP: 2.0000 | Freq: Every day | NASAL | 0 refills | Status: DC

## 2020-09-03 MED ORDER — PSEUDOEPHEDRINE HCL 30 MG PO TABS: 30.0000 mg | ORAL_TABLET | Freq: Four times a day (QID) | ORAL | 0 refills | Status: DC | PRN

## 2020-09-03 NOTE — ED Provider Notes (Signed)
[facility] EMERGENCY DEPARTMENT Provider Note   CSN: [id number] Arrival date & time: 09/03/20  1136  History Chief Complaint  Patient presents with   Dizziness   Anemia   Tamara Mccarthy is a 39 y.o. female.  39 year old female presents with dizziness worsening for the past couple of weeks which she contributes to her anemia.  She has been treated in the last month for a repeat and is not currently having any active bleeding.  Hemoglobin today is 11.3. In addition to her dizziness patient has had clear nasal discharge, bilateral ear fullness and intermittent clear drainage and some intermittent dry coughing and shortness of breath.  She states that the breathing issues resolved with her inhalers.  The dizziness is worse with leaning forward or sitting up from laying position. She does not have any dizziness at baseline without movement or head turning. She is able to ambulate. Endorses frontal sinus pressure. Denies fevers. She has a nasal spray which may have modestly helped.  She was not able to obtain Covid testing per recommendations by attending at her most recent clinic appointment.     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, [place]    [place] History   No obstetric history on  file.    Family History  Problem Relation Age of Onset   Alcohol abuse Mother    Depression Mother    Hypertension Mother    Drug abuse Mother    Alcohol abuse Father    Depression Father    Drug abuse Father    Depression Maternal Grandmother    Depression Maternal Grandfather    Social History   Tobacco Use   Smoking status: Former Smoker    Types: Cigarettes, E-cigarettes    Quit date: 2004    Years since quitting: 17.7   Smokeless tobacco: Never Used  Substance Use Topics   Alcohol use: Never   Drug use: Never    Home Medications Prior to Admission medications   Medication Sig Start Date End Date Taking? Authorizing Provider  albuterol (VENTOLIN HFA) 108 (90 Base) MCG/ACT inhaler Inhale 2 puffs into the lungs every 4 (four) hours as needed for wheezing or shortness of breath. 08/29/20   Latrelle Dodrill, MD  clindamycin (CLEOCIN) 100 MG vaginal suppository Place 1 suppository (100 mg total) vaginally at bedtime. 08/08/20   Melene Plan, MD  ergocalciferol (VITAMIN D2) 1.25 MG (50000 UT) capsule Take 1 capsule (50,000 Units total) by mouth once a week for 8 doses. 08/13/20 10/02/20  Melene Plan, MD  ferumoxytol [facility]) 510 MG/17ML SOLN injection Inject 17 mLs (510 mg total) into the vein once for 1 dose. 08/20/20 08/20/20  Sandre Kitty, MD  FLUoxetine [facility])  20 MG capsule Take 1 capsule (20 mg total) by mouth daily for 7 days, THEN 2 capsules (40 mg total) daily for 21 days. 08/08/20 09/05/20  Melene PlanKim, Rachel E, MD  fluticasone (FLONASE) 50 MCG/ACT nasal spray Place 2 sprays into both nostrils daily. 09/03/20   Taci Sterling L, DO  guaiFENesin (MUCINEX) 600 MG 12 hr tablet Take 1 tablet (600 mg total) by mouth 2 (two) times daily as needed for to loosen phlegm. 09/03/20   Dareen PianoAnderson, Hurschel Paynter L, DO  predniSONE (DELTASONE) 50 MG tablet Take 1 tablet (50 mg total) by mouth daily with breakfast. For 5 days 08/29/20   Latrelle DodrillMcIntyre, Brittany J, MD  pseudoephedrine  (SUDAFED) 30 MG tablet Take 1 tablet (30 mg total) by mouth every 6 (six) hours as needed for congestion. 09/03/20 09/03/21  Othel Dicostanzo L, DO  rosuvastatin (CRESTOR) 20 MG tablet Take 1 tablet (20 mg total) by mouth daily. 08/13/20   Melene PlanKim, Rachel E, MD  valACYclovir (VALTREX) 500 MG tablet Take 1 tablet (500 mg total) by mouth daily. 07/16/20   Melene PlanKim, Rachel E, MD    Allergies    Metronidazole  Review of Systems   Review of Systems  Physical Exam Updated Vital Signs BP (!) 114/91 (BP Location: Right Arm)    Pulse 94    Temp 98.7 F (37.1 C) (Oral)    Resp 18    Ht 5\' 4"  (1.626 m)    Wt 108.9 kg    LMP 08/23/2020    SpO2 98%    BMI 41.20 kg/m   Physical Exam Vitals and nursing note reviewed.  Constitutional:      General: She is not in acute distress.    Appearance: Normal appearance. She is not ill-appearing.  HENT:     Head: Normocephalic.     Right Ear: Ear canal and external ear normal. No drainage, swelling or tenderness. A middle ear effusion is present. There is no impacted cerumen. No mastoid tenderness. Tympanic membrane is not erythematous.     Left Ear: Ear canal and external ear normal. No drainage, swelling or tenderness. A middle ear effusion is present. There is no impacted cerumen. No mastoid tenderness. Tympanic membrane is not erythematous.     Ears:     Comments: dix-hallpike negative    Nose: Nasal tenderness and rhinorrhea present. Rhinorrhea is clear.     Right Turbinates: Swollen.     Left Turbinates: Swollen.     Right Sinus: Maxillary sinus tenderness and frontal sinus tenderness present.     Left Sinus: No maxillary sinus tenderness or frontal sinus tenderness.     Mouth/Throat:     Mouth: Mucous membranes are moist. No oral lesions.     Tongue: No lesions.     Pharynx: Oropharynx is clear. Posterior oropharyngeal erythema present. No oropharyngeal exudate.     Tonsils: No tonsillar exudate or tonsillar abscesses.  Eyes:     Pupils: Pupils are equal,  round, and reactive to light.  Cardiovascular:     Rate and Rhythm: Normal rate and regular rhythm.     Pulses: Normal pulses.     Heart sounds: Normal heart sounds.  Pulmonary:     Effort: Pulmonary effort is normal.     Breath sounds: Normal breath sounds.  Abdominal:     Palpations: Abdomen is soft.  Musculoskeletal:     Cervical back: No tenderness.  Lymphadenopathy:     Cervical: Cervical adenopathy present.  Skin:    General: Skin is warm  and dry.     Capillary Refill: Capillary refill takes less than 2 seconds.  Neurological:     General: No focal deficit present.     Mental Status: She is alert.  Psychiatric:        Mood and Affect: Mood normal.        Behavior: Behavior normal.     ED Results / Procedures / Treatments   Labs (all labs ordered are listed, but only abnormal results are displayed) Labs Reviewed  CBC - Abnormal; Notable for the following components:      Result Value   WBC 12.0 (*)    Hemoglobin 11.3 (*)    MCV 79.1 (*)    MCH 22.9 (*)    MCHC 28.9 (*)    RDW 17.2 (*)    Platelets 460 (*)    All other components within normal limits  SARS CORONAVIRUS 2 BY RT PCR (HOSPITAL ORDER, PERFORMED IN [facility])  BASIC METABOLIC PANEL  I-STAT BETA HCG BLOOD, ED ([facility], [facility], [facility] ONLY)  TYPE AND SCREEN  ABO/RH    EKG EKG Interpretation  Date/Time:  Wednesday September 03 2020 11:42:19 EDT Ventricular Rate:  88 PR Interval:  158 QRS Duration: 82 QT Interval:  346 QTC Calculation: 418 R Axis:   46 Text Interpretation: Normal sinus rhythm Nonspecific T wave abnormality Abnormal ECG Confirmed by Margarita Grizzle [phone]) on 09/03/2020 2:47:18 PM   Radiology No results found.  Procedures Procedures (including critical care time)  Medications Ordered in ED Medications - No data to display  ED Course  I have reviewed the triage vital signs and the nursing notes.  Pertinent labs & imaging results that were available during my care of the  patient were reviewed by me and considered in my medical decision making (see chart for details).   MDM Rules/Calculators/A&P                         Patient having positional dizziness with exacerbation from hot shower this morning which is not present at rest during exam. Can be recreated by having patient sit up or lean forward. Also has tenderness to sinus percussion and bilateral ear effusions on exam. Her symptoms are likely related to viral sinusitis so will treat for sinus congestion. Did not see signs of bacterial infection on exam. Will test for COVID-19 today, especially since patient is resident at shelter and has 3 minor children. She has received first vaccine. Instructed to follow up with PCP if not improving.  Unlikely BPV with negative dix-hallpike. Do not suspect cardiac cause with no chest pain, and normal ECG. Unlikely to be related to anemia with an improved hemoglobin of 11.3. She will still keep her faraheme appointment 9/27.    Final Clinical Impression(s) / ED Diagnoses Final diagnoses:  Bilateral otitis media with effusion  Viral sinusitis   Rx / DC Orders ED Discharge Orders         Ordered    guaiFENesin (MUCINEX) 600 MG 12 hr tablet  2 times daily PRN        09/03/20 1733    fluticasone (FLONASE) 50 MCG/ACT nasal spray  Daily        09/03/20 1733    pseudoephedrine (SUDAFED) 30 MG tablet  Every 6 hours PRN        09/03/20 1733           Leeroy Bock, DO 09/03/20 1748    Jeraldine Loots,  Molly Maduro, MD 09/03/20 [phone]

## 2020-09-03 NOTE — ED Notes (Signed)
C/o heavy menstrual cycle for the past three weeks- seen at [facility] outpatient women[facility]- given "pills to stop bleeding" is supposed to have Iron infusion, but has not had that scheduled yet-  Stays in family shelter, case manager dropped off 3 children.  No bleeding at present

## 2020-09-03 NOTE — Telephone Encounter (Signed)
LVm for pt to call office to let her know that her iron infusion is scheduled on 09/15/2020 @ 10:00am at [place].  She should enter in at main entrance and inform them she is there for iron infusion and they will instruct her where to go. Will also send a mychart message with appointment time as well. Satchel Heidinger Zimmerman Rumple, CMA

## 2020-09-03 NOTE — ED Triage Notes (Signed)
Pt reports dizziness that has been going on for about a month when she had a heavy menstrual cycle for about 3 weeks in aug. But the dizziness got worse today when she got out of the shower. Pt is supposed to have an iron infusion done but states she has not heard from anyone to get it set up. Pt denies any vaginal bleeding at this time, states she was given a medication that stopped it. Pt a.o, dizziness worse with ambulation.

## 2020-09-03 NOTE — Discharge Instructions (Addendum)
Your hgb has improved to 11.3 and you have a normal ECG so I don't think that anemia is the main cause of your symptoms. It does appear that you have an excessive amount of fluid in your ears and sinus canals that is probably caused by mucus obstructing drainage of your sinuses. I am prescribing medications to help with the drainage. You can also use hot steam to help such as with a humidifier or in a shower. Please follow up with your PCP if not improving with this treatment. You will be contacted with your COVID test results. Please try to isolate until those return.

## 2020-09-05 ENCOUNTER — Encounter: Payer: Self-pay | Admitting: Family Medicine

## 2020-09-05 MED ORDER — MECLIZINE HCL 12.5 MG PO TABS: 12.5000 mg | ORAL_TABLET | Freq: Three times a day (TID) | ORAL | 0 refills | Status: DC | PRN

## 2020-09-05 NOTE — Telephone Encounter (Signed)
Patient calls nurse line to follow up on myChart message, requesting medication to help with vertigo.   Patient reports having vertigo in the past and being prescribed medication to help with dizziness. Upon chart review, it seems that patient received meclizine 25 mg tablet in 2017.   If appropriate, please send medication for vertigo to [facility] on [address].   Veronda Prude, RN

## 2020-09-08 ENCOUNTER — Encounter: Payer: Self-pay | Admitting: Family Medicine

## 2020-09-08 NOTE — Telephone Encounter (Signed)
Patient calls nurse line requesting abx for sinus infection. Patient reports ear popping, migraine headaches, and enlarged tonsils.   Please advise if patient should be scheduled in [facility] clinic. However, next available appointment is not until next Monday, 9/27.   To PCP  Veronda Prude, RN

## 2020-09-09 ENCOUNTER — Other Ambulatory Visit: Payer: Self-pay

## 2020-09-09 ENCOUNTER — Emergency Department (HOSPITAL_COMMUNITY)
Admission: EM | Admit: 2020-09-09 | Discharge: 2020-09-09 | Disposition: A | Discharge status: Home / Self Care | Payer: Medicaid Other | Attending: Emergency Medicine | Admitting: Emergency Medicine

## 2020-09-09 ENCOUNTER — Encounter (HOSPITAL_COMMUNITY): Payer: Self-pay | Admitting: Emergency Medicine

## 2020-09-09 DIAGNOSIS — R531 Weakness: Secondary | ICD-10-CM | POA: Diagnosis not present

## 2020-09-09 DIAGNOSIS — N390 Urinary tract infection, site not specified: Secondary | ICD-10-CM | POA: Diagnosis not present

## 2020-09-09 DIAGNOSIS — J45909 Unspecified asthma, uncomplicated: Secondary | ICD-10-CM | POA: Diagnosis not present

## 2020-09-09 DIAGNOSIS — M62838 Other muscle spasm: Secondary | ICD-10-CM

## 2020-09-09 DIAGNOSIS — R251 Tremor, unspecified: Secondary | ICD-10-CM | POA: Insufficient documentation

## 2020-09-09 DIAGNOSIS — R58 Hemorrhage, not elsewhere classified: Secondary | ICD-10-CM | POA: Diagnosis not present

## 2020-09-09 DIAGNOSIS — Z87891 Personal history of nicotine dependence: Secondary | ICD-10-CM | POA: Insufficient documentation

## 2020-09-09 DIAGNOSIS — I10 Essential (primary) hypertension: Secondary | ICD-10-CM | POA: Insufficient documentation

## 2020-09-09 DIAGNOSIS — R29810 Facial weakness: Secondary | ICD-10-CM | POA: Diagnosis not present

## 2020-09-09 DIAGNOSIS — R000 Tachycardia, unspecified: Secondary | ICD-10-CM | POA: Diagnosis not present

## 2020-09-09 DIAGNOSIS — R259 Unspecified abnormal involuntary movements: Secondary | ICD-10-CM | POA: Diagnosis not present

## 2020-09-09 DIAGNOSIS — R42 Dizziness and giddiness: Secondary | ICD-10-CM | POA: Diagnosis not present

## 2020-09-09 LAB — URINALYSIS, ROUTINE W REFLEX MICROSCOPIC
Bilirubin Urine: NEGATIVE
Glucose, UA: NEGATIVE mg/dL
Hgb urine dipstick: NEGATIVE
Ketones, ur: NEGATIVE mg/dL
Nitrite: NEGATIVE
Protein, ur: NEGATIVE mg/dL
Specific Gravity, Urine: 1.013 (ref 1.005–1.030)
pH: 5 (ref 5.0–8.0)

## 2020-09-09 LAB — COMPREHENSIVE METABOLIC PANEL WITH GFR
ALT: 25 U/L (ref 0–44)
AST: 19 U/L (ref 15–41)
Albumin: 3.7 g/dL (ref 3.5–5.0)
Alkaline Phosphatase: 64 U/L (ref 38–126)
Anion gap: 11 (ref 5–15)
BUN: 8 mg/dL (ref 6–20)
CO2: 19 mmol/L — ABNORMAL LOW (ref 22–32)
Calcium: 8.9 mg/dL (ref 8.9–10.3)
Chloride: 109 mmol/L (ref 98–111)
Creatinine, Ser: 0.93 mg/dL (ref 0.44–1.00)
GFR calc Af Amer: >60 mL/min
GFR calc non Af Amer: >60 mL/min
Glucose, Bld: 133 mg/dL — ABNORMAL HIGH (ref 70–99)
Potassium: 3.6 mmol/L (ref 3.5–5.1)
Sodium: 139 mmol/L (ref 135–145)
Total Bilirubin: 0.6 mg/dL (ref 0.3–1.2)
Total Protein: 7.6 g/dL (ref 6.5–8.1)

## 2020-09-09 LAB — CBC
HCT: 41.6 % (ref 36.0–46.0)
Hemoglobin: 12.2 g/dL (ref 12.0–15.0)
MCH: 24 pg — ABNORMAL LOW (ref 26.0–34.0)
MCHC: 29.3 g/dL — ABNORMAL LOW (ref 30.0–36.0)
MCV: 81.7 fL (ref 80.0–100.0)
Platelets: 502 K/uL — ABNORMAL HIGH (ref 150–400)
RBC: 5.09 MIL/uL (ref 3.87–5.11)
RDW: 17.5 % — ABNORMAL HIGH (ref 11.5–15.5)
WBC: 9.3 K/uL (ref 4.0–10.5)
nRBC: 0 % (ref 0.0–0.2)

## 2020-09-09 LAB — TROPONIN I (HIGH SENSITIVITY)
Troponin I (High Sensitivity): 3 ng/L
Troponin I (High Sensitivity): 4 ng/L (ref <18)

## 2020-09-09 LAB — I-STAT BETA HCG BLOOD, ED (MC, WL, AP ONLY): I-stat hCG, quantitative: <5 m[IU]/mL (ref <5)

## 2020-09-09 LAB — LIPASE, BLOOD: Lipase: 40 U/L (ref 11–51)

## 2020-09-09 MED ORDER — DIAZEPAM 2 MG PO TABS
2.0000 mg | ORAL_TABLET | Freq: Once | ORAL | Status: AC
  Administered 2020-09-09: 2 mg via ORAL
  Filled 2020-09-09: qty 1

## 2020-09-09 MED ORDER — CEPHALEXIN 500 MG PO CAPS: 500.0000 mg | ORAL_CAPSULE | Freq: Three times a day (TID) | ORAL | 0 refills | Status: AC

## 2020-09-09 MED ORDER — CEPHALEXIN 250 MG PO CAPS
500.0000 mg | ORAL_CAPSULE | Freq: Once | ORAL | Status: AC
  Administered 2020-09-09: 500 mg via ORAL
  Filled 2020-09-09: qty 2

## 2020-09-09 NOTE — ED Triage Notes (Addendum)
Arrived via EMS from homeless shelter. Onset today developed all extremity muscle tightness and jaw. Seen last week for ear infection. Patient added also developed abdominal cramping today.

## 2020-09-09 NOTE — Telephone Encounter (Signed)
Patient should be seen in the office given her symptoms, unfortunately we do not have any appointments available.  Please recommend that patient go to urgent care for further evaluation and for antibiotics.

## 2020-09-09 NOTE — Discharge Instructions (Signed)
Please schedule a close follow-up with your primary doctor.  Call their office tomorrow morning.  Recommend taking the antibiotic for your possible urinary tract infection.  Recommend staying hydrated.  If you develop worsening weakness, numbness, vision changes or speech changes, recommend return to ER for reassessment.

## 2020-09-09 NOTE — ED Provider Notes (Signed)
[facility] Provider Note   CSN: [id number] Arrival date & time: 09/09/20  1009     History Chief Complaint  Patient presents with  . Muscle Pain  . Abdominal Cramping    Tamara Mccarthy is a 39 y.o. female.   Presents to ER with concern for muscle cramping.  Patient reports that she had similar symptoms yesterday but they seem to worsening today.  Has cramps in her arms legs as well as across her abdomen and low back.  Also has occasional episodes of shaking of her arms.  States that they seem to come and go at random, at times makes it feel like her muscles are weak.  She denies any current weakness, no associated numbness.  No vision changes or speech changes.  Has had some dysuria for the past 2 weeks.  No fevers. No vaginal discharge or vaginal bleeding.    HPI     Past Medical History:  Diagnosis Date  . Acid reflux 2009  . Anxiety 2004  . Asthma 2013  . High cholesterol 2020  . Hypertension 2020    Patient Active Problem List   Diagnosis Date Noted  . Abnormal uterine bleeding (AUB) 08/23/2020  . Episode of shaking 08/11/2020  . Encounter for immunization 08/11/2020  . Anxiety 08/11/2020  . BMI 40.0-44.9, adult (HCC) 07/16/2020  . Vitamin D deficiency 07/16/2020  . Anemia 07/16/2020  . 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 Asthma 07/16/2020  . Vaginal discharge 07/16/2020    Past Surgical History:  Procedure Laterality Date  . CESAREAN SECTION  2002  . CESAREAN SECTION  2008  . CESAREAN SECTION  2011  . CESAREAN SECTION  2013  . HERNIA REPAIR  2005   [place], [place]     [place] History   No obstetric history on file.     Family History  Problem Relation Age of Onset  . Alcohol abuse Mother   . Depression Mother   . Hypertension Mother   . Drug abuse Mother   . Alcohol abuse Father   . Depression Father   . Drug abuse Father   . Depression Maternal Grandmother   . Depression Maternal  Grandfather     Social History   Tobacco Use  . Smoking status: Former Smoker    Types: Cigarettes, E-cigarettes    Quit date: 2004    Years since quitting: 17.7  . Smokeless tobacco: Never Used  Substance Use Topics  . Alcohol use: Never  . Drug use: Never    Home Medications Prior to Admission medications   Medication Sig Start Date End Date Taking? Authorizing Provider  albuterol (VENTOLIN HFA) 108 (90 Base) MCG/ACT inhaler Inhale 2 puffs into the lungs every 4 (four) hours as needed for wheezing or shortness of breath. 08/29/20   Latrelle DodrillMcIntyre, Brittany J, MD  cephALEXin (KEFLEX) 500 MG capsule Take 1 capsule (500 mg total) by mouth 3 (three) times daily for 5 days. 09/09/20 09/14/20  Milagros Lollykstra, Jyair Kiraly S, MD  clindamycin (CLEOCIN) 100 MG vaginal suppository Place 1 suppository (100 mg total) vaginally at bedtime. 08/08/20   Melene PlanKim, Rachel E, MD  ergocalciferol (VITAMIN D2) 1.25 MG (50000 UT) capsule Take 1 capsule (50,000 Units total) by mouth once a week for 8 doses. 08/13/20 10/02/20  Melene PlanKim, Rachel E, MD  ferumoxytol [facility](FERAHEME) 510 MG/17ML SOLN injection Inject 17 mLs (510 mg total) into the vein once for 1 dose. 08/20/20 08/20/20  Sandre Kittylson, Daniel K, MD  FLUoxetine (  PROZAC) 40 MG capsule Take 1 capsule (40 mg total) by mouth daily. 09/04/20   Melene Plan, MD  fluticasone (FLONASE) 50 MCG/ACT nasal spray Place 2 sprays into both nostrils daily. 09/03/20   Anderson, Chelsey L, DO  guaiFENesin (MUCINEX) 600 MG 12 hr tablet Take 1 tablet (600 mg total) by mouth 2 (two) times daily as needed for to loosen phlegm. 09/03/20   Anderson, Chelsey L, DO  meclizine (ANTIVERT) 12.5 MG tablet Take 1 tablet (12.5 mg total) by mouth 3 (three) times daily as needed for dizziness. 09/05/20   Melene Plan, MD  predniSONE (DELTASONE) 50 MG tablet Take 1 tablet (50 mg total) by mouth daily with breakfast. For 5 days 08/29/20   Latrelle Dodrill, MD  pseudoephedrine (SUDAFED) 30 MG tablet Take 1 tablet (30 mg total) by  mouth every 6 (six) hours as needed for congestion. 09/03/20 09/03/21  Anderson, Chelsey L, DO  rosuvastatin (CRESTOR) 20 MG tablet Take 1 tablet (20 mg total) by mouth daily. 08/13/20   Melene Plan, MD  valACYclovir (VALTREX) 500 MG tablet Take 1 tablet (500 mg total) by mouth daily. 07/16/20   Melene Plan, MD    Allergies    Metronidazole  Review of Systems   Review of Systems  Constitutional: Negative for chills and fever.  HENT: Negative for ear pain and sore throat.   Eyes: Negative for pain and visual disturbance.  Respiratory: Negative for cough and shortness of breath.   Cardiovascular: Negative for chest pain and palpitations.  Gastrointestinal: Positive for abdominal pain. Negative for vomiting.  Genitourinary: Negative for dysuria and hematuria.  Musculoskeletal: Positive for arthralgias, back pain and myalgias.  Skin: Negative for color change and rash.  Neurological: Positive for tremors. Negative for seizures and syncope.  All other systems reviewed and are negative.   Physical Exam Updated Vital Signs BP (!) 144/93 (BP Location: Left Arm)   Pulse (!) 102   Temp 98.6 F (37 C) (Oral)   Resp 16   Ht 5\' 4"  (1.626 m)   Wt 108.9 kg   LMP 08/23/2020   SpO2 100%   BMI 41.20 kg/m   Physical Exam Vitals and nursing note reviewed.  Constitutional:      General: She is not in acute distress.    Appearance: She is well-developed.  HENT:     Head: Normocephalic and atraumatic.  Eyes:     Conjunctiva/sclera: Conjunctivae normal.  Cardiovascular:     Rate and Rhythm: Regular rhythm. Tachycardia present.     Heart sounds: No murmur heard.   Pulmonary:     Effort: Pulmonary effort is normal. No respiratory distress.     Breath sounds: Normal breath sounds.  Abdominal:     General: There is no distension.     Palpations: Abdomen is soft. There is no mass.     Tenderness: There is no abdominal tenderness. There is no guarding or rebound.     Hernia: No hernia is  present.  Musculoskeletal:        General: No swelling, tenderness, deformity or signs of injury.     Cervical back: Neck supple.  Skin:    General: Skin is warm and dry.     Capillary Refill: Capillary refill takes less than 2 seconds.  Neurological:     Mental Status: She is alert.     Comments: AAOx3 CN 2-12 intact, speech clear visual fields intact 5/5 strength in b/l UE and LE Sensation to light  touch intact in b/l UE and LE Normal FNF Normal patellar DTRs  Psychiatric:        Mood and Affect: Mood normal.     ED Results / Procedures / Treatments   Labs (all labs ordered are listed, but only abnormal results are displayed) Labs Reviewed  COMPREHENSIVE METABOLIC PANEL - Abnormal; Notable for the following components:      Result Value   CO2 19 (*)    Glucose, Bld 133 (*)    All other components within normal limits  CBC - Abnormal; Notable for the following components:   MCH 24.0 (*)    MCHC 29.3 (*)    RDW 17.5 (*)    Platelets 502 (*)    All other components within normal limits  URINALYSIS, ROUTINE W REFLEX MICROSCOPIC - Abnormal; Notable for the following components:   APPearance HAZY (*)    Leukocytes,Ua MODERATE (*)    Bacteria, UA RARE (*)    All other components within normal limits  LIPASE, BLOOD  I-STAT BETA HCG BLOOD, ED ([facility], [facility], [facility] ONLY)  I-STAT BETA HCG BLOOD, ED ([facility], [facility], [facility] ONLY)  TROPONIN I (HIGH SENSITIVITY)  TROPONIN I (HIGH SENSITIVITY)    EKG EKG Interpretation  Date/Time:  Tuesday September 09 2020 10:51:24 EDT Ventricular Rate:  109 PR Interval:  154 QRS Duration: 84 QT Interval:  320 QTC Calculation: 430 R Axis:   28 Text Interpretation: Sinus tachycardia Nonspecific T wave abnormality Abnormal ECG Confirmed by Marianna Fuss ([id number]) on 09/09/2020 8:47:52 PM   Radiology No results found.  Procedures Procedures (including critical care time)  Medications Ordered in ED Medications  diazepam (VALIUM) tablet 2 mg (2 mg Oral  Given 09/09/20 2130)  cephALEXin (KEFLEX) capsule 500 mg (500 mg Oral Given 09/09/20 2130)    ED Course  I have reviewed the triage vital signs and the nursing notes.  Pertinent labs & imaging results that were available during my care of the patient were reviewed by me and considered in my medical decision making (see chart for details).    MDM Rules/Calculators/A&P                          39 year old lady who presented to ER with concern for muscle cramping.  She describes cramping in her arms as well as legs as well as abdomen and back.  On physical exam, patient is noted to have mild tachycardia but otherwise stable vital signs, she is remarkably well-appearing in no acute distress.  No focal deficits on neurologic exam.  Lab work was grossly stable.  UA with possible UTI.  She reported some dysuria, will treat with cephalexin.  Her abdomen was soft, nontender, given exam and labs, doubt acute abdominal process.  Suspect these are muscle cramps based on symptomatology and reassuring physical exam.  Provided patient with small dose of Valium and she reported improvement.  Recommend schedule close follow-up with her primary doctor, reviewed precautions and discharged home.    After the discussed management above, the patient was determined to be safe for discharge.  The patient was in agreement with this plan and all questions regarding their care were answered.  ED return precautions were discussed and the patient will return to the ED with any significant worsening of condition.  Final Clinical Impression(s) / ED Diagnoses Final diagnoses:  Muscle spasm  Episode of shaking  Urinary tract infection without hematuria, site unspecified    Rx / DC Orders  ED Discharge Orders         Ordered    cephALEXin (KEFLEX) 500 MG capsule  3 times daily        09/09/20 2210           Milagros Loll, MD 09/09/20 2223

## 2020-09-12 ENCOUNTER — Other Ambulatory Visit (HOSPITAL_COMMUNITY): Payer: Self-pay | Admitting: *Deleted

## 2020-09-15 ENCOUNTER — Inpatient Hospital Stay (HOSPITAL_COMMUNITY): Admit: 2020-09-15 | Payer: Medicaid Other

## 2020-09-17 ENCOUNTER — Telehealth: Payer: Self-pay | Admitting: *Deleted

## 2020-09-17 NOTE — Telephone Encounter (Signed)
Sent My Chart message to pt for F/U from ER.Tamara Mccarthy, CMA

## 2020-09-17 NOTE — Telephone Encounter (Signed)
-----   Message from Melene Plan, MD sent at 09/10/2020 12:58 PM EDT -----  ----- Message ----- From: Milagros Loll, MD Sent: 09/09/2020   9:17 PM EDT To: Melene Plan, MD  Hi Dr. Selena Batten,  I saw your patient in the ER tonight. She was complaining of severe muscle cramps and muscle tightness. I did not find any focal neuro deficits and her labs were reassuring. Can you help arrange f/u in your clinic?  Thanks! Raiford Noble

## 2020-09-19 ENCOUNTER — Encounter (HOSPITAL_COMMUNITY): Admission: RE | Admit: 2020-09-19 | Payer: Medicaid Other | Source: Ambulatory Visit

## 2020-09-23 ENCOUNTER — Ambulatory Visit (HOSPITAL_COMMUNITY)
Admission: RE | Admit: 2020-09-23 | Discharge: 2020-09-23 | Disposition: A | Discharge status: Home / Self Care | Payer: Medicaid Other | Source: Ambulatory Visit | Attending: Family Medicine | Admitting: Family Medicine

## 2020-09-23 ENCOUNTER — Other Ambulatory Visit: Payer: Self-pay

## 2020-09-23 DIAGNOSIS — D509 Iron deficiency anemia, unspecified: Secondary | ICD-10-CM | POA: Diagnosis not present

## 2020-09-23 MED ORDER — SODIUM CHLORIDE 0.9 % IV SOLN
510.0000 mg | Freq: Once | INTRAVENOUS | Status: AC
  Administered 2020-09-23: 510 mg via INTRAVENOUS
  Filled 2020-09-23: qty 17

## 2020-09-23 NOTE — Discharge Instructions (Signed)

## 2020-09-28 ENCOUNTER — Other Ambulatory Visit: Payer: Self-pay | Admitting: Family Medicine

## 2020-09-29 ENCOUNTER — Ambulatory Visit: Payer: Medicaid Other | Admitting: Family Medicine

## 2020-09-30 ENCOUNTER — Ambulatory Visit: Payer: Medicaid Other | Admitting: Family Medicine

## 2020-10-03 ENCOUNTER — Ambulatory Visit: Payer: Medicaid Other

## 2020-10-03 NOTE — Telephone Encounter (Signed)
Pt has appointment on 10/07/2020. Tamara Mccarthy, CMA

## 2020-10-06 ENCOUNTER — Encounter: Payer: Self-pay | Admitting: Family Medicine

## 2020-10-06 DIAGNOSIS — R42 Dizziness and giddiness: Secondary | ICD-10-CM | POA: Diagnosis not present

## 2020-10-06 DIAGNOSIS — R0602 Shortness of breath: Secondary | ICD-10-CM | POA: Diagnosis not present

## 2020-10-06 DIAGNOSIS — R1084 Generalized abdominal pain: Secondary | ICD-10-CM | POA: Diagnosis not present

## 2020-10-06 DIAGNOSIS — R52 Pain, unspecified: Secondary | ICD-10-CM | POA: Diagnosis not present

## 2020-10-06 DIAGNOSIS — R000 Tachycardia, unspecified: Secondary | ICD-10-CM | POA: Diagnosis not present

## 2020-10-07 ENCOUNTER — Other Ambulatory Visit: Payer: Self-pay

## 2020-10-07 ENCOUNTER — Ambulatory Visit: Payer: Medicaid Other | Admitting: Family Medicine

## 2020-10-07 VITALS — BP 130/90 | HR 122 | Ht 64.0 in | Wt 234.0 lb

## 2020-10-07 DIAGNOSIS — R42 Dizziness and giddiness: Secondary | ICD-10-CM

## 2020-10-07 DIAGNOSIS — R030 Elevated blood-pressure reading, without diagnosis of hypertension: Secondary | ICD-10-CM

## 2020-10-07 DIAGNOSIS — R202 Paresthesia of skin: Secondary | ICD-10-CM

## 2020-10-07 DIAGNOSIS — F419 Anxiety disorder, unspecified: Secondary | ICD-10-CM

## 2020-10-07 DIAGNOSIS — H938X3 Other specified disorders of ear, bilateral: Secondary | ICD-10-CM | POA: Diagnosis not present

## 2020-10-07 NOTE — Assessment & Plan Note (Signed)
Intermittent x1 month.  Ashby Dawes of issue does not appear to be neurological.  Likely related to stress and anxiety being that it happens around the same time every day and is intermittent.  Patient today has normal gait normal neurological exam and no red flags.  Using shared decision-making patient would prefer to get lab work today.  Moving forward she will likely need psychiatry to weigh in and to have anxiety treated. -Obtain TSH, B12, folate -Continue to follow-up with PCP or symptoms get worse

## 2020-10-07 NOTE — Progress Notes (Signed)
SUBJECTIVE:   CHIEF COMPLAINT / HPI:   [name] is a 39 yo F who presents to access to care for the below issue.   Bilateral leg numbness Has multiple complaints today but main issue is that she feels like she cannot walk. During the afternoon around 2-3 p.m. legs go numb and she feels like she cannot walk.  Able to walk in the morning but this happens every day.  Recently went to the kitchen to get something to eat and legs and arms locked up and got stiff. Associated jaw pain and headache. Feels tingling everywhere in the body and is intermittent. Has a history of dizziness currently taking meclizine when needed.  She is also taking vitamin B12.  Also continues to have ear fullness. Denies any trauma, headache, vision changes, hearing loss.  Would like lab work today.  Anxiety In the process of seeking care and to a place called [place].  Awaiting Medicaid insurance.  Also has a Child psychotherapist that is helping her get care.  Not currently taking Prozac that is on her med list.  Endorses that sometimes she shakes in her sleep in has occasional panic attacks.  Of note she lives in a shelter with her children and this is a source of stressor.  PERTINENT  PMH / PSH: Asthma (albuterol as needed), vitamin D deficiency (takes vitamin D supplementation), history of anemia (last hemoglobin 9/21 was 12.2), HSV (taking Valtrex)  OBJECTIVE:   BP 130/90    Pulse (!) 122    Ht 5\' 4"  (1.626 m)    Wt 234 lb (106.1 kg)    SpO2 98%    BMI 40.17 kg/m   General: Appears well, no acute distress. Age appropriate. HEENT: Normocephalic, no conjunctiva.  Nares patent.  Tympanic membrane on right with mild fluid collection. Left tympanic membrane unremarkable. Cardiac: RRR, normal heart sounds, no murmurs Respiratory: CTAB, normal effort Neuro: alert and orientedx4 CN II-XII intact, U+L reflexes 2+ bilateral, normal gait and balance.  Psych: normal affect PHQ9 SCORE ONLY 10/07/2020 08/08/2020 07/16/2020  PHQ-9  Total Score 2 0 0    ASSESSMENT/PLAN:   Paresthesia of lower extremity Intermittent x1 month.  07/18/2020 of issue does not appear to be neurological.  Likely related to stress and anxiety being that it happens around the same time every day and is intermittent.  Patient today has normal gait normal neurological exam and no red flags.  Using shared decision-making patient would prefer to get lab work today.  Moving forward she will likely need psychiatry to weigh in and to have anxiety treated. -Obtain TSH, B12, folate -Continue to follow-up with PCP or symptoms get worse  Anxiety Endorsing intermittent panic attacks.  Currently under a lot of stress with living in a shelter with her children.  She does have a Ashby Dawes that is at the shelter and helping her.  Attempting to get in with psychiatry/psychology when Medicaid available.  Not currently taking Prozac at this time due to patient preference. -Obtain GAD-7 at follow-up -Follow with patient to ensure she has made connections with behavioral health  Dizziness Chronic. Discussed with PCP >1 month prior. Associated ear fullness and suspected vertigo from 1 month prior as well. Exam with mild fluid present behind right tympanic membrane. Non-infectious appearing. Gait and balance are normal. No other red flags.  Would treat with supportive care at this time.  -continue meclizine as needed -F/u if symptoms worsen or change  Elevated BP without diagnosis of  hypertension DBP 90 today. -BP recheck in 1-2 weeks w/ PCP -Consider the need to add antihypertensives   Lavonda Jumbo, DO [facility]

## 2020-10-07 NOTE — Patient Instructions (Addendum)
It was wonderful to see you today.  Please bring ALL of your medications with you to every visit.   Today we talked about:  Occasional leg numbness.  We discussed that this is likely not a neurological issue and may be related to stress and anxiety.  Please follow-up with behavioral health.  Continue to take meclizine when feeling dizzy.  Today we got blood work if normal I will communicate via MyChart if abnormal I will call you.  Please be sure to schedule follow up in 1 to 2 weeks with Dr. Selena Batten for a blood pressure recheck and to follow-up on ear fullness.   Please call the clinic at [phone] if your symptoms worsen or you have any concerns. It was our pleasure to serve you.  Dr. Salvadore Dom

## 2020-10-07 NOTE — Assessment & Plan Note (Addendum)
Chronic. Discussed with PCP >1 month prior. Associated ear fullness and suspected vertigo from 1 month prior as well. Exam with mild fluid present behind right tympanic membrane. Non-infectious appearing. Gait and balance are normal. No other red flags.  Would treat with supportive care at this time.  -continue meclizine as needed -F/u if symptoms worsen or change

## 2020-10-07 NOTE — Assessment & Plan Note (Signed)
DBP 90 today. -BP recheck in 1-2 weeks w/ PCP -Consider the need to add antihypertensives

## 2020-10-07 NOTE — Assessment & Plan Note (Signed)
Endorsing intermittent panic attacks.  Currently under a lot of stress with living in a shelter with her children.  She does have a [employer] cabin crew that is at the shelter and helping her.  Attempting to get in with psychiatry/psychology when Medicaid available.  Not currently taking Prozac at this time due to patient preference. -Obtain GAD-7 at follow-up -Follow with patient to ensure she has made connections with behavioral health

## 2020-10-08 LAB — B12 AND FOLATE PANEL
Folate: 13.9 ng/mL (ref >3.0)
Vitamin B-12: 243 pg/mL (ref 232–1245)

## 2020-10-08 LAB — TSH: TSH: 1.08 u[IU]/mL (ref 0.450–4.500)

## 2020-10-15 ENCOUNTER — Ambulatory Visit: Payer: Medicaid Other | Admitting: Family Medicine

## 2020-10-15 ENCOUNTER — Encounter: Payer: Self-pay | Admitting: Family Medicine

## 2020-10-15 ENCOUNTER — Other Ambulatory Visit: Payer: Self-pay

## 2020-10-15 VITALS — BP 134/86 | HR 104 | Ht 64.0 in | Wt 236.2 lb

## 2020-10-15 DIAGNOSIS — R42 Dizziness and giddiness: Secondary | ICD-10-CM | POA: Diagnosis not present

## 2020-10-15 DIAGNOSIS — N939 Abnormal uterine and vaginal bleeding, unspecified: Secondary | ICD-10-CM | POA: Diagnosis not present

## 2020-10-15 DIAGNOSIS — F419 Anxiety disorder, unspecified: Secondary | ICD-10-CM | POA: Diagnosis not present

## 2020-10-15 DIAGNOSIS — R030 Elevated blood-pressure reading, without diagnosis of hypertension: Secondary | ICD-10-CM

## 2020-10-15 MED ORDER — CETIRIZINE HCL 10 MG PO TABS: 10.0000 mg | ORAL_TABLET | Freq: Every day | ORAL | 11 refills | Status: DC

## 2020-10-15 MED ORDER — FLUTICASONE PROPIONATE 50 MCG/ACT NA SUSP
2.0000 | Freq: Every day | NASAL | 11 refills | Status: DC
Start: 2020-10-15 — End: 2021-10-28

## 2020-10-15 MED ORDER — FLUTICASONE PROPIONATE 50 MCG/ACT NA SUSP
2.0000 | Freq: Every day | NASAL | 11 refills | Status: DC
Start: 2020-10-15 — End: 2020-10-15

## 2020-10-15 NOTE — Assessment & Plan Note (Signed)
Patient has had continued vaginal bleeding that has continued despite progesterone therapy. Patient does not have any family history of endometrial carcinoma. Risk factors for endometrial carcinoma include obesity. She is a Therapist, sports. No history of ovarian tumors, though history of ovarian cyst. No history of PCOS. Radiology recommendations for focal lesion workup with sonohysterogram.  Referral to obstetrics and gynecology

## 2020-10-15 NOTE — Assessment & Plan Note (Signed)
Dix-Hallpike negative today.  Unlikely to be due to inner ear issues.  Patient reports some subjective dizziness when sitting up from lying down on exam today.  She does have recent abnormal uterine bleeding.  Will check CBC today.  Otherwise, patient given return precautions and instructions to change positions slowly and stay hydrated.

## 2020-10-15 NOTE — Assessment & Plan Note (Signed)
Patient has not started Prozac.  Have encouraged her to start the Prozac and to discontinue if she experiences any side effects.  She is agreeable for this.  Follow-up in 2 weeks.

## 2020-10-15 NOTE — Progress Notes (Deleted)
   Current Outpatient Medications  Medication Instructions  . albuterol (VENTOLIN HFA) 108 (90 Base) MCG/ACT inhaler 2 puffs, Inhalation, Every 4 hours PRN  . clindamycin (CLEOCIN) 100 mg, Vaginal, Daily at bedtime  . FLUoxetine (PROZAC) 40 mg, Oral, Daily  . fluticasone (FLONASE) 50 MCG/ACT nasal spray 2 sprays, Each Nare, Daily  . guaiFENesin (MUCINEX) 600 mg, Oral, 2 times daily PRN  . meclizine (ANTIVERT) 12.5 mg, Oral, 3 times daily PRN  . rosuvastatin (CRESTOR) 20 mg, Oral, Daily  . valACYclovir (VALTREX) 500 mg, Oral, Daily  . Vitamin D, Ergocalciferol, (DRISDOL) 1.25 MG (50000 UNIT) CAPS capsule TAKE 1 CAPSULE BY MOUTH 1 TIME A WEEK FOR 8 DOSES   Health Maintenance Due  Topic Date Due  . INFLUENZA VACCINE  Never done  . COVID-19 Vaccine (2 - Pfizer 2-dose series) 08/29/2020   PHQ9 SCORE ONLY 10/15/2020 10/07/2020 08/08/2020  PHQ-9 Total Score 2 2 0     SUBJECTIVE:  CHIEF COMPLAINT / HPI:     PERTINENT  PMH / PSH: ***  Patient Active Problem List   Diagnosis Date Noted  . Paresthesia of lower extremity 10/07/2020  . Dizziness 10/07/2020  . Ear fullness, bilateral 10/07/2020  . Elevated BP without diagnosis of hypertension 10/07/2020  . Abnormal uterine bleeding (AUB) 08/23/2020  . Episode of shaking 08/11/2020  . Encounter for immunization 08/11/2020  . Anxiety 08/11/2020  . BMI 40.0-44.9, adult (HCC) 07/16/2020  . Vitamin D deficiency 07/16/2020  . Anemia 07/16/2020  . 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 Asthma 07/16/2020  . Vaginal discharge 07/16/2020    OBJECTIVE:  BP 134/86   Pulse (!) 104   Ht 5\' 4"  (1.626 m)   Wt 236 lb 3.2 oz (107.1 kg)   LMP 09/10/2020 (Approximate)   SpO2 96%   BMI 40.54 kg/m   ***  ASSESSMENT/PLAN:  No problem-specific Assessment & Plan notes found for this encounter.    09/12/2020, MD [facility]

## 2020-10-15 NOTE — Patient Instructions (Addendum)
Blood pressure: if your number is greater than 140/90  Generalized anxiety: Please start taking the Prozac. If you have any side effects, please call us and stop taking the medication  Vaginal bleeding: I am sending you an urgent referral to obstetrics and gynecology for further work-up of the ultrasound findings as you are still continuing to bleed. Please continue to take your iron daily. We are going to check your labs today for any anemia.   Sinuses: Please take the Flonase as discussed today. You can also start Zyrtec.  Dizziness: Please be careful when standing up after sitting down or moving positions very quickly. This can cause you dizziness if you are anemic, which means you have low blood counts.  Please come back for the muscle spasm evaluation and work-up

## 2020-10-15 NOTE — Progress Notes (Signed)
    SUBJECTIVE:  CHIEF COMPLAINT / HPI:   1. Abnormal uterine bleeding (AUB) Patient reports she has continued to bleed. She had a cycle that started on 09/10/2020 back continues. She does report taking a prescribed medication that makes the bleeding stop, however, as soon as she stops taking the medication, the bleeding starts again. She was seen in September by Dr. Constance Goltz who ordered transvaginal ultrasound. Findings were significant for 17 mm heterogenous endometrial complex. She also reports some dizziness that is consistent with orthostasis.  2. Sinus issues and nose bleeding  Patient reports that she continues to have active draining from her nose.  She also reports that when she blows her nose, it bleeds sometimes.  She is using Flonase at this time.  She reports trying Flonase straight upwards in her nares.  3. Blood pressures  Patient has been tracking her blood pressures at home.  Her blood pressures are in the low 100s to 120s over 70s.  They are within normal limits.  She does not take any blood pressure medications at this time.  4. Anxiety Patient previously seen for anxiety.  Her GAD-7 score today is 7.  She was started on Prozac, but reports not taking it as she was afraid of the side effect of suicidal ideation.  She denies any SI today.  PERTINENT  PMH / PSH: Generalized anxiety, abnormal uterine bleeding   OBJECTIVE:  BP 134/86   Pulse (!) 104   Ht 5\' 4"  (1.626 m)   Wt 236 lb 3.2 oz (107.1 kg)   LMP 09/10/2020 (Approximate)   SpO2 96%   BMI 40.54 kg/m   General: Well-appearing female, no acute distress, obese HEENT: TMs pearly gray, no exudate no erythema.  Oropharynx clear without erythema.  Turbinates mildly swollen bilaterally.  Nares patent.  Dix-Hallpike maneuver negative bilaterally.  ASSESSMENT/PLAN:  Elevated BP without diagnosis of hypertension Patient has been measuring her blood pressures at home and they are all within normal limits.  No blood pressure  medication at this time.  Can continue to monitor at visits.  Abnormal uterine bleeding (AUB) Patient has had continued vaginal bleeding that has continued despite progesterone therapy. Patient does not have any family history of endometrial carcinoma. Risk factors for endometrial carcinoma include obesity. She is a 09/12/2020. No history of ovarian tumors, though history of ovarian cyst. No history of PCOS. Radiology recommendations for focal lesion workup with sonohysterogram.  Referral to obstetrics and gynecology  Dizziness Dix-Hallpike negative today.  Unlikely to be due to inner ear issues.  Patient reports some subjective dizziness when sitting up from lying down on exam today.  She does have recent abnormal uterine bleeding.  Will check CBC today.  Otherwise, patient given return precautions and instructions to change positions slowly and stay hydrated.  Anxiety Patient has not started Prozac.  Have encouraged her to start the Prozac and to discontinue if she experiences any side effects.  She is agreeable for this.  Follow-up in 2 weeks.    Therapist, sports, MD [facility]

## 2020-10-15 NOTE — Assessment & Plan Note (Signed)
Patient has been measuring her blood pressures at home and they are all within normal limits.  No blood pressure medication at this time.  Can continue to monitor at visits.

## 2020-10-16 ENCOUNTER — Encounter: Payer: Self-pay | Admitting: Family Medicine

## 2020-10-16 LAB — CBC
Hematocrit: 41.2 % (ref 34.0–46.6)
Hemoglobin: 12.7 g/dL (ref 11.1–15.9)
MCH: 25.1 pg — ABNORMAL LOW (ref 26.6–33.0)
MCHC: 30.8 g/dL — ABNORMAL LOW (ref 31.5–35.7)
MCV: 82 fL (ref 79–97)
Platelets: 400 x10E3/uL (ref 150–450)
RBC: 5.05 x10E6/uL (ref 3.77–5.28)
RDW: 19.6 % — ABNORMAL HIGH (ref 11.7–15.4)
WBC: 7.3 x10E3/uL (ref 3.4–10.8)

## 2020-10-18 MED ORDER — IRON (FERROUS SULFATE) 325 (65 FE) MG PO TABS
1.0000 | ORAL_TABLET | ORAL | 11 refills | Status: DC
Start: 2020-10-20 — End: 2023-02-16

## 2020-10-18 NOTE — Telephone Encounter (Signed)
Called patient about blood results. Checked CBC for patient's hx of continued uterine bleeding. RBC indices most likely indicate an iron deficiency. Sent iron to pharmacy for MWF dosing. Follow up in three months to recheck CBC.

## 2020-10-23 ENCOUNTER — Encounter: Payer: Self-pay | Admitting: Family Medicine

## 2020-10-23 ENCOUNTER — Other Ambulatory Visit: Payer: Self-pay

## 2020-10-23 ENCOUNTER — Emergency Department (HOSPITAL_COMMUNITY)
Admission: EM | Admit: 2020-10-23 | Discharge: 2020-10-23 | Disposition: A | Discharge status: Home / Self Care | Payer: Medicaid Other | Attending: Emergency Medicine | Admitting: Emergency Medicine

## 2020-10-23 ENCOUNTER — Other Ambulatory Visit: Payer: Self-pay | Admitting: Family Medicine

## 2020-10-23 DIAGNOSIS — I10 Essential (primary) hypertension: Secondary | ICD-10-CM | POA: Insufficient documentation

## 2020-10-23 DIAGNOSIS — Z87891 Personal history of nicotine dependence: Secondary | ICD-10-CM | POA: Insufficient documentation

## 2020-10-23 DIAGNOSIS — R52 Pain, unspecified: Secondary | ICD-10-CM | POA: Diagnosis not present

## 2020-10-23 DIAGNOSIS — E860 Dehydration: Secondary | ICD-10-CM | POA: Insufficient documentation

## 2020-10-23 DIAGNOSIS — J45909 Unspecified asthma, uncomplicated: Secondary | ICD-10-CM | POA: Diagnosis not present

## 2020-10-23 DIAGNOSIS — R202 Paresthesia of skin: Secondary | ICD-10-CM | POA: Diagnosis not present

## 2020-10-23 DIAGNOSIS — R000 Tachycardia, unspecified: Secondary | ICD-10-CM | POA: Diagnosis not present

## 2020-10-23 DIAGNOSIS — R42 Dizziness and giddiness: Secondary | ICD-10-CM | POA: Diagnosis present

## 2020-10-23 DIAGNOSIS — R079 Chest pain, unspecified: Secondary | ICD-10-CM | POA: Diagnosis not present

## 2020-10-23 DIAGNOSIS — R531 Weakness: Secondary | ICD-10-CM | POA: Diagnosis not present

## 2020-10-23 DIAGNOSIS — R059 Cough, unspecified: Secondary | ICD-10-CM | POA: Diagnosis not present

## 2020-10-23 LAB — BASIC METABOLIC PANEL
Anion gap: 12 (ref 5–15)
BUN: 9 mg/dL (ref 6–20)
CO2: 18 mmol/L — ABNORMAL LOW (ref 22–32)
Calcium: 8.5 mg/dL — ABNORMAL LOW (ref 8.9–10.3)
Chloride: 107 mmol/L (ref 98–111)
Creatinine, Ser: 0.75 mg/dL (ref 0.44–1.00)
GFR, Estimated: >60 mL/min (ref >60)
Glucose, Bld: 135 mg/dL — ABNORMAL HIGH (ref 70–99)
Potassium: 3.8 mmol/L (ref 3.5–5.1)
Sodium: 137 mmol/L (ref 135–145)

## 2020-10-23 LAB — CBC WITH DIFFERENTIAL/PLATELET
Abs Immature Granulocytes: 0.02 10*3/uL (ref 0.00–0.07)
Basophils Absolute: 0.1 10*3/uL (ref 0.0–0.1)
Basophils Relative: 1 %
Eosinophils Absolute: 0.1 10*3/uL (ref 0.0–0.5)
Eosinophils Relative: 1 %
HCT: 42.5 % (ref 36.0–46.0)
Hemoglobin: 12.9 g/dL (ref 12.0–15.0)
Immature Granulocytes: 0 %
Lymphocytes Relative: 33 %
Lymphs Abs: 2.3 10*3/uL (ref 0.7–4.0)
MCH: 25.5 pg — ABNORMAL LOW (ref 26.0–34.0)
MCHC: 30.4 g/dL (ref 30.0–36.0)
MCV: 84.2 fL (ref 80.0–100.0)
Monocytes Absolute: 0.7 10*3/uL (ref 0.1–1.0)
Monocytes Relative: 10 %
Neutro Abs: 3.9 10*3/uL (ref 1.7–7.7)
Neutrophils Relative %: 55 %
Platelets: 403 10*3/uL — ABNORMAL HIGH (ref 150–400)
RBC: 5.05 MIL/uL (ref 3.87–5.11)
RDW: 20.8 % — ABNORMAL HIGH (ref 11.5–15.5)
WBC: 7 10*3/uL (ref 4.0–10.5)
nRBC: 0 % (ref 0.0–0.2)

## 2020-10-23 LAB — TROPONIN I (HIGH SENSITIVITY): Troponin I (High Sensitivity): 2 ng/L (ref <18)

## 2020-10-23 MED ORDER — SODIUM CHLORIDE 0.9 % IV BOLUS
500.0000 mL | Freq: Once | INTRAVENOUS | Status: AC
  Administered 2020-10-23: 500 mL via INTRAVENOUS

## 2020-10-23 NOTE — ED Provider Notes (Signed)
[facility]-EMERGENCY DEPT Provider Note   CSN: [id number] Arrival date & time: 10/23/20  1330     History Chief Complaint  Patient presents with  . Dizziness    Tamara Mccarthy is a 39 y.o. female.  She has a history of anxiety hypertension.  She is living at a shelter.  She said after she ate she laid down and then started shaking all over.  Associated with dizziness like things were spinning but also lightheadedness like she might pass out.  She also felt some pain in her chest.  Her symptoms have improved since being transported by ambulance.  She denies ever having them before but her visit history shows dizziness and panic attacks in the past.  She also states she is having difficulty walking and this has been going on for a few months.  The history is provided by the patient.  Dizziness Quality:  Lightheadedness and room spinning Severity:  Severe Onset quality:  Sudden Duration: minutes. Progression:  Improving Chronicity:  New Relieved by:  None tried Worsened by:  Nothing Ineffective treatments:  None tried Associated symptoms: chest pain   Associated symptoms: no headaches, no palpitations, no shortness of breath, no tinnitus, no vision changes and no vomiting        Past Medical History:  Diagnosis Date  . Acid reflux 2009  . Anxiety 2004  . Asthma 2013  . High cholesterol 2020  . Hypertension 2020    Patient Active Problem List   Diagnosis Date Noted  . Dizziness 10/07/2020  . Elevated BP without diagnosis of hypertension 10/07/2020  . Abnormal uterine bleeding (AUB) 08/23/2020  . Encounter for immunization 08/11/2020  . Anxiety 08/11/2020  . BMI 40.0-44.9, adult (HCC) 07/16/2020  . Vitamin D deficiency 07/16/2020  . Anemia 07/16/2020  . Herpes simplex virus (HSV) infection 07/16/2020    Past Surgical History:  Procedure Laterality Date  . CESAREAN SECTION  2002  . CESAREAN SECTION  2008  . CESAREAN SECTION  2011  .  CESAREAN SECTION  2013  . HERNIA REPAIR  2005   [place], [place]     OB History   No obstetric history on file.     Family History  Problem Relation Age of Onset  . Alcohol abuse Mother   . Depression Mother   . Hypertension Mother   . Drug abuse Mother   . Alcohol abuse Father   . Depression Father   . Drug abuse Father   . Depression Maternal Grandmother   . Depression Maternal Grandfather     Social History   Tobacco Use  . Smoking status: Former Smoker    Types: Cigarettes, E-cigarettes    Quit date: 2004    Years since quitting: 17.8  . Smokeless tobacco: Never Used  Substance Use Topics  . Alcohol use: Never  . Drug use: Never    Home Medications Prior to Admission medications   Medication Sig Start Date End Date Taking? Authorizing Provider  albuterol (VENTOLIN HFA) 108 (90 Base) MCG/ACT inhaler Inhale 2 puffs into the lungs every 4 (four) hours as needed for wheezing or shortness of breath. 08/29/20   Latrelle Dodrill, MD  cetirizine (ZYRTEC) 10 MG tablet Take 1 tablet (10 mg total) by mouth daily. 10/15/20   Melene Plan, MD  FLUoxetine (PROZAC) 40 MG capsule Take 1 capsule (40 mg total) by mouth daily. Patient not taking: Reported on 10/07/2020 09/04/20   Melene Plan, MD  fluticasone [facility]) 50  MCG/ACT nasal spray Place 2 sprays into both nostrils daily. 10/15/20   Melene Plan, MD  guaiFENesin (MUCINEX) 600 MG 12 hr tablet Take 1 tablet (600 mg total) by mouth 2 (two) times daily as needed for to loosen phlegm. Patient not taking: Reported on 10/07/2020 09/03/20   Leeroy Bock, DO  Iron, Ferrous Sulfate, 325 (65 Fe) MG TABS Take 1 tablet by mouth every Monday, Wednesday, and Friday. Take with a meal 10/20/20   Melene Plan, MD  rosuvastatin (CRESTOR) 20 MG tablet Take 1 tablet (20 mg total) by mouth daily. 08/13/20   Melene Plan, MD  valACYclovir (VALTREX) 500 MG tablet Take 1 tablet (500 mg total) by mouth daily. 07/16/20   Melene Plan, MD    Vitamin D, Ergocalciferol, (DRISDOL) 1.25 MG (50000 UNIT) CAPS capsule TAKE 1 CAPSULE BY MOUTH 1 TIME A WEEK FOR 8 DOSES 09/29/20   Melene Plan, MD    Allergies    Metronidazole  Review of Systems   Review of Systems  Constitutional: Negative for fever.  HENT: Negative for tinnitus.   Eyes: Negative for visual disturbance.  Respiratory: Negative for shortness of breath.   Cardiovascular: Positive for chest pain. Negative for palpitations.  Gastrointestinal: Negative for vomiting.  Genitourinary: Negative for frequency.  Musculoskeletal: Positive for gait problem.  Skin: Negative for rash.  Neurological: Positive for dizziness. Negative for headaches.    Physical Exam Updated Vital Signs BP 133/89 (BP Location: Left Arm)   Pulse 100   Temp 98.5 F (36.9 C) (Oral)   SpO2 100%   Physical Exam Vitals and nursing note reviewed.  Constitutional:      General: She is not in acute distress.    Appearance: Normal appearance. She is well-developed. She is obese.  HENT:     Head: Normocephalic and atraumatic.  Eyes:     Conjunctiva/sclera: Conjunctivae normal.  Cardiovascular:     Rate and Rhythm: Normal rate and regular rhythm.     Heart sounds: No murmur heard.   Pulmonary:     Effort: Pulmonary effort is normal. No respiratory distress.     Breath sounds: Normal breath sounds.  Abdominal:     Palpations: Abdomen is soft.     Tenderness: There is no abdominal tenderness.  Musculoskeletal:        General: No deformity or signs of injury. Normal range of motion.     Cervical back: Neck supple.  Skin:    General: Skin is warm and dry.     Capillary Refill: Capillary refill takes less than 2 seconds.  Neurological:     General: No focal deficit present.     Mental Status: She is alert.     ED Results / Procedures / Treatments   Labs (all labs ordered are listed, but only abnormal results are displayed) Labs Reviewed  BASIC METABOLIC PANEL - Abnormal; Notable for  the following components:      Result Value   CO2 18 (*)    Glucose, Bld 135 (*)    Calcium 8.5 (*)    All other components within normal limits  CBC WITH DIFFERENTIAL/PLATELET - Abnormal; Notable for the following components:   MCH 25.5 (*)    RDW 20.8 (*)    Platelets 403 (*)    All other components within normal limits  TROPONIN I (HIGH SENSITIVITY)  TROPONIN I (HIGH SENSITIVITY)    EKG EKG Interpretation  Date/Time:  Thursday October 23 2020 14:08:13 EDT Ventricular  Rate:  101 PR Interval:    QRS Duration: 83 QT Interval:  337 QTC Calculation: 437 R Axis:   53 Text Interpretation: Sinus tachycardia Low voltage, precordial leads Borderline T wave abnormalities No significant change since prior 9/21 Confirmed by Meridee Score [id number]) on 10/23/2020 2:32:10 PM   Radiology No results found.  Procedures Procedures (including critical care time)  Medications Ordered in ED Medications  sodium chloride 0.9 % bolus 500 mL (has no administration in time range)    ED Course  I have reviewed the triage vital signs and the nursing notes.  Pertinent labs & imaging results that were available during my care of the patient were reviewed by me and considered in my medical decision making (see chart for details).    MDM Rules/Calculators/A&P                         This patient complains of dizziness shaking weakness chest pain; this involves an extensive number of treatment Options and is a complaint that carries with it a high risk of complications and Morbidity. The differential includes ACS, vertigo, metabolic derangement, dehydration, arrhythmia, panic attack  I ordered, reviewed and interpreted labs, which included CBC with normal white count normal hemoglobin, chemistries fairly normal other than low bicarb and mildly elevated glucose, troponin high-sensitivity at 2 I ordered medication IV fluids Previous records obtained and reviewed in epic including primary care  doctor visits discussing anxiety and panic attacks and patient not taking her anxiety medicine  After the interventions stated above, I reevaluated the patient and found patient's vital signs to be fairly unremarkable other than mild tachycardia.  She signed out to oncoming provider to follow-up on response to fluid and the rest of her lab results.  Likely she can be discharged back to follow-up with her primary care doctor.   Final Clinical Impression(s) / ED Diagnoses Final diagnoses:  Dehydration    Rx / DC Orders ED Discharge Orders    None       Terrilee Files, MD 10/23/20 2138

## 2020-10-23 NOTE — Progress Notes (Addendum)
..   Transition of Care [facility]) - Emergency Department Mini Assessment   Patient Details  Name: Terresa Marlett MRN: [id number] Date of Birth: [birth year]  [facility] CM/SW Contact:    Elliot Cousin, RN Phone Number: [phone] 10/23/2020, 5:48 PM   Clinical Narrative:  Jory Sims CM contacted [facility] for ride back to [facility] [employer] [facility]. [facility] scanned to transportation. Pt's [facility] # [phone] brought 3 sons to the ED as they cannot be at shelter without guardian. She provided her contact if pt needs any assistance at shelter or if she needs to pick up son if pt is admitted. We made her aware that pt with dc today.   ED Mini Assessment: What brought you to the Emergency Department? : dizziness  Barriers to Discharge: No Barriers Identified     Means of departure: Hospital Transport  Interventions which prevented an admission or readmission: Transportation Screening    Patient Contact and Communications        ,                 Admission diagnosis:  fever;cough Patient Active Problem List   Diagnosis Date Noted  . Dizziness 10/07/2020  . Elevated BP without diagnosis of hypertension 10/07/2020  . Abnormal uterine bleeding (AUB) 08/23/2020  . Encounter for immunization 08/11/2020  . Anxiety 08/11/2020  . BMI 40.0-44.9, adult (HCC) 07/16/2020  . Vitamin D deficiency 07/16/2020  . Anemia 07/16/2020  . Herpes simplex virus (HSV) infection 07/16/2020   PCP:  Melene Plan, MD Pharmacy:   [facility] [phone] - [place], [place] - [address] AT [facility] [address] [place] [address] Phone: [phone] Fax: [phone]

## 2020-10-23 NOTE — ED Triage Notes (Signed)
Presents via EMS with c/o dizziness and shaking all over, lasting about 2 minutes. States it all started after eating.

## 2020-10-23 NOTE — ED Triage Notes (Signed)
[facility] states pt complain of chills and weakness, Vitals  137/98 R 20 CBG 180

## 2020-10-24 MED ORDER — ALBUTEROL SULFATE HFA 108 (90 BASE) MCG/ACT IN AERS: 2.0000 | INHALATION_SPRAY | RESPIRATORY_TRACT | 0 refills | Status: DC | PRN

## 2020-10-24 NOTE — Telephone Encounter (Signed)
Spoke to patient over the phone. Reviewed test results from the ED with patient, which were reassuring and did not show any obvious cause for patient's dizziness.  Encourage patient to stay hydrated as well. Patient requested refill for asthma inhaler.  She reports that she is using this sparingly and is otherwise not having any trouble breathing.  She does not use this multiple times a day.  She reports that her sinuses cause her to be congested and sometimes requires her to use her asthma.  Return precautions to the ED provided. We will hold on refilling Megace at this time.  I want to double check with OB/GYN prior to filling to ensure that this will not affect any testing she may need.  Will get back to patient on Monday as it is end of the workday Friday.

## 2020-10-27 ENCOUNTER — Other Ambulatory Visit: Payer: Self-pay

## 2020-10-27 ENCOUNTER — Ambulatory Visit: Payer: Medicaid Other | Admitting: Family Medicine

## 2020-10-27 VITALS — BP 110/80 | HR 100 | Ht 64.0 in | Wt 238.5 lb

## 2020-10-27 DIAGNOSIS — F419 Anxiety disorder, unspecified: Secondary | ICD-10-CM

## 2020-10-27 DIAGNOSIS — Z09 Encounter for follow-up examination after completed treatment for conditions other than malignant neoplasm: Secondary | ICD-10-CM | POA: Diagnosis not present

## 2020-10-27 NOTE — Assessment & Plan Note (Addendum)
ED visit reviewed.  Lab work grossly normal.  Was given IV fluids for dehydration. Patient admits that most of her symptoms are likely anxiety induced.  She agrees that she needs treatment.  Plans to follow-up with behavioral health.  With shared decision making we decided not to start any medication today.  We will follow-up in 2 weeks and revisit conversation about starting an anxiolytic as most of these do have side effects of suicidality.  Discussed extensively with patient that if these do occur that we are available for conversation on decreasing or stopping the medication.  Otherwise patient appears to be well and in vital signs are stable.

## 2020-10-27 NOTE — Progress Notes (Signed)
    SUBJECTIVE:   CHIEF COMPLAINT / HPI:   [name] is a 39 year old female who presents to access care for the issues below.  Follow up Was seen in the ED 11/4 due to feeling dizzy and weak.  Was diagnosed with dehydration.  Continues to endorse shaking when lying down as well as driving.  Here today to follow-up on these issues.  Continues to endorse intermittent dizziness.  Was taking meclizine for this prior.  Anxiety Endorses continued anxiety especially when driving.  Recently stopped driving 1 month prior.  The precipitating episode that stopped her from driving was coming from a 2-hour trip driving in the dark and felt as if she could not see.  She did pull over experienced heavy breathing and shortness of breath and when she eventually was able to calm down.  She has not started Prozac due to fear of the side effects of developing suicidal thoughts.  She recently received her Medicaid card and plans to get [facility].  Endorses that she has a a lot of life stressors such as mother of 3 kids and living in a shelter.  PERTINENT  PMH / PSH: As above  OBJECTIVE:   BP 110/80   Pulse 100 Comment: 2nd attempt  Ht 5\' 4"  (1.626 m)   Wt 238 lb 8 oz (108.2 kg)   SpO2 98%   BMI 40.94 kg/m   General: Appears well, no acute distress. Age appropriate. Cardiac: RRR, normal heart sounds, no murmurs Respiratory: CTAB, normal effort Psych: normal affect  GAD 7 : Generalized Anxiety Score 10/27/2020 10/15/2020 08/11/2020  Nervous, Anxious, on Edge 2 3 1   Control/stop worrying 1 1 1   Worry too much - different things 1 1 1   Trouble relaxing 1 1 1   Restless 1 0 0  Easily annoyed or irritable 0 0 0  Afraid - awful might happen 0 1 1  Total GAD 7 Score 6 7 5   Anxiety Difficulty - - Somewhat difficult   ASSESSMENT/PLAN:   Anxiety ED visit reviewed.  Lab work grossly normal.  Patient admits that most of her symptoms are likely anxiety induced.   She agrees that she needs treatment.  Plans to follow-up with behavioral health.  With shared decision making we decided not to start any medication today.  We will follow-up in 2 weeks and revisit conversation about starting an anxiolytic as most of these do have side effects of suicidality.  Discussed extensively with patient that if these do occur that we are available for conversation on decreasing or stopping the medication.  Otherwise patient appears to be well and in vital signs are stable.   08/13/2020, DO [facility]

## 2020-10-27 NOTE — Patient Instructions (Addendum)
It was wonderful to see you today.  Please bring ALL of your medications with you to every visit.   Today we talked about:  Anxiety and events related to this.   We discussed that our plan was to get connect with behavioral health through your shelter, get scheduled for an eye exam, and follow up in two week with the consideration of starting medication.   Please be sure to schedule follow up at the front  desk before you leave today.   Please call the clinic at [phone] if your symptoms worsen or you have any concerns. It was our pleasure to serve you.  Dr. Salvadore Dom

## 2020-10-28 ENCOUNTER — Encounter: Payer: Self-pay | Admitting: Family Medicine

## 2020-10-28 MED ORDER — MEGESTROL ACETATE 40 MG PO TABS: 40.0000 mg | ORAL_TABLET | Freq: Two times a day (BID) | ORAL | 0 refills | Status: DC

## 2020-10-28 NOTE — Telephone Encounter (Signed)
Spoke to [facility] faculty provider on call, Dr. Alysia Penna, who recommended 40 mg Megace twice daily for this patient.  Will not interfere with any potential work-up from a gynecology standpoint.  Patient follows up with Dr. Alysia Penna on 12/01/2020.   Melene Plan, M.D.  1:05 PM 10/28/2020

## 2020-11-07 DIAGNOSIS — I10 Essential (primary) hypertension: Secondary | ICD-10-CM | POA: Diagnosis not present

## 2020-11-07 DIAGNOSIS — R000 Tachycardia, unspecified: Secondary | ICD-10-CM | POA: Diagnosis not present

## 2020-11-07 DIAGNOSIS — R0789 Other chest pain: Secondary | ICD-10-CM | POA: Diagnosis not present

## 2020-11-07 DIAGNOSIS — R079 Chest pain, unspecified: Secondary | ICD-10-CM | POA: Diagnosis not present

## 2020-11-07 DIAGNOSIS — F410 Panic disorder [episodic paroxysmal anxiety] without agoraphobia: Secondary | ICD-10-CM | POA: Diagnosis not present

## 2020-11-07 DIAGNOSIS — R0689 Other abnormalities of breathing: Secondary | ICD-10-CM | POA: Diagnosis not present

## 2020-11-10 NOTE — Progress Notes (Signed)
    SUBJECTIVE:   CHIEF COMPLAINT / HPI:   Tamara Mccarthy is 72 F who presents for the issues below.   Anxiety Recently started therapy session 1/wk. Would like to start a medication today. Previously taking lexapro and felt that was beneficial.   High blood sugar Concern about high blood sugar and having diabetes. Last seen in ED and it was high. Has never been diagnosed with diabetes.   Dysuria Burning sensation with urination for 5 days. Denies vaginal discharge, odor, or sexual activity.   PERTINENT  PMH / PSH: AUB (On iron and megace GYN appt next month), Hx of HSV  OBJECTIVE:   BP 106/74   Pulse (!) 102   Wt 240 lb 12.8 oz (109.2 kg)   SpO2 99%   BMI 41.33 kg/m   GAD 7 : Generalized Anxiety Score 11/11/2020 10/27/2020 10/15/2020 08/11/2020  Nervous, Anxious, on Edge 3 2 3 1   Control/stop worrying 2 1 1 1   Worry too much - different things 2 1 1 1   Trouble relaxing 2 1 1 1   Restless 1 1 0 0  Easily annoyed or irritable 1 0 0 0  Afraid - awful might happen 1 0 1 1  Total GAD 7 Score 12 6 7 5   Anxiety Difficulty - - - Somewhat difficult   PHQ9 SCORE ONLY 11/11/2020 10/27/2020 10/15/2020  PHQ-9 Total Score 2 1 2    Results for orders placed or performed in visit on 11/11/20 (from the past 48 hour(s))  POCT urinalysis dipstick     Status: None   Collection Time: 11/11/20 10:30 AM  Result Value Ref Range   Color, UA yellow yellow   Clarity, UA clear clear   Glucose, UA negative negative mg/dL   Bilirubin, UA negative negative   Ketones, POC UA negative negative mg/dL   Spec Grav, UA 11/13/2020 13/07/2020 - 1.025   Blood, UA negative negative   pH, UA 6.5 5.0 - 8.0   Protein Ur, POC negative negative mg/dL   Urobilinogen, UA 0.2 0.2 or 1.0 E.U./dL   Nitrite, UA Negative Negative   Leukocytes, UA Negative Negative  HgB A1c     Status: Abnormal   Collection Time: 11/11/20 10:30 AM  Result Value Ref Range   Hemoglobin A1C 6.4 (A) 4.0 - 5.6 %   HbA1c POC (<> result, manual entry)      HbA1c, POC (prediabetic range)     HbA1c, POC (controlled diabetic range)    General: Appears well, no acute distress. Age appropriate. Cardiac: RRR, normal heart sounds, no murmurs Respiratory: CTAB, normal effort  ASSESSMENT/PLAN:   Anxiety Increased GAD score from last visit. Could benefit from medicinal therapy in additional to pscyhological therapy.  -Continue with therapist 1x/wk -Start lexapro 10 mg daily -Follow up in 2 weeks for mood check  Elevated glucose level Last CBG 10/23/20 at ED. A1c today 6.4 indicating prediabetes (resulted following patient visit). Prior A1c 5.3 12/06/2019.  -Will phone patient to discuss nutrition management and option of referral given these results.   Dysuria Ongoing for 5 days. UA clean. Etiology unknown at this time. Pelvic exam not performed during this visit.  -Consider wet mount/STD testing at follow up visit.    11/13/20, DO [facility]

## 2020-11-11 ENCOUNTER — Other Ambulatory Visit: Payer: Self-pay

## 2020-11-11 ENCOUNTER — Encounter: Payer: Self-pay | Admitting: Family Medicine

## 2020-11-11 ENCOUNTER — Ambulatory Visit: Payer: Medicaid Other | Admitting: Family Medicine

## 2020-11-11 VITALS — BP 106/74 | HR 102 | Wt 240.8 lb

## 2020-11-11 DIAGNOSIS — R300 Dysuria: Secondary | ICD-10-CM | POA: Diagnosis not present

## 2020-11-11 DIAGNOSIS — F419 Anxiety disorder, unspecified: Secondary | ICD-10-CM

## 2020-11-11 DIAGNOSIS — R7309 Other abnormal glucose: Secondary | ICD-10-CM | POA: Diagnosis not present

## 2020-11-11 LAB — POCT URINALYSIS DIP (MANUAL ENTRY)
Bilirubin, UA: NEGATIVE
Blood, UA: NEGATIVE
Glucose, UA: NEGATIVE mg/dL
Ketones, POC UA: NEGATIVE mg/dL
Leukocytes, UA: NEGATIVE
Nitrite, UA: NEGATIVE
Protein Ur, POC: NEGATIVE mg/dL
Spec Grav, UA: 1.025 (ref 1.010–1.025)
Urobilinogen, UA: 0.2 E.U./dL
pH, UA: 6.5 (ref 5.0–8.0)

## 2020-11-11 LAB — POCT GLYCOSYLATED HEMOGLOBIN (HGB A1C): Hemoglobin A1C: 6.4 % — AB (ref 4.0–5.6)

## 2020-11-11 MED ORDER — ESCITALOPRAM OXALATE 10 MG PO TABS: 10.0000 mg | ORAL_TABLET | Freq: Every day | ORAL | 0 refills | Status: DC

## 2020-11-11 NOTE — Patient Instructions (Addendum)
It was wonderful to see you today.  Please bring ALL of your medications with you to every visit.   Today we talked about:  Starting lexapro. We will follow up in 2 weeks for a mood check. Please continue your therapy sessions.   We tested your urine for a possible urinary tract infection. I will call you if labs are abnormal.   You are concerned about your blood sugar. We will get an A1c today, will discuss if abnormal.   Please be sure to keep follow up with your gynecologist.   Please be sure to schedule follow up at the front  desk before you leave today.   Please call the clinic at [phone] if you have any concerns. It was our pleasure to serve you.  Dr. Salvadore Dom

## 2020-11-12 DIAGNOSIS — R300 Dysuria: Secondary | ICD-10-CM | POA: Insufficient documentation

## 2020-11-12 DIAGNOSIS — R7309 Other abnormal glucose: Secondary | ICD-10-CM | POA: Insufficient documentation

## 2020-11-12 DIAGNOSIS — R7303 Prediabetes: Secondary | ICD-10-CM | POA: Insufficient documentation

## 2020-11-12 NOTE — Assessment & Plan Note (Signed)
Ongoing for 5 days. UA clean. Etiology unknown at this time. Pelvic exam not performed during this visit.  -Consider wet mount/STD testing at follow up visit.

## 2020-11-12 NOTE — Assessment & Plan Note (Signed)
Last CBG 10/23/20 at ED. A1c today 6.4 indicating prediabetes (resulted following patient visit). Prior A1c 5.3 12/06/2019.  -Will phone patient to discuss nutrition management and option of referral given these results.

## 2020-11-12 NOTE — Assessment & Plan Note (Signed)
Increased GAD score from last visit. Could benefit from medicinal therapy in additional to pscyhological therapy.  -Continue with therapist 1x/wk -Start lexapro 10 mg daily -Follow up in 2 weeks for mood check

## 2020-11-17 ENCOUNTER — Ambulatory Visit (INDEPENDENT_AMBULATORY_CARE_PROVIDER_SITE_OTHER): Payer: Medicaid Other | Admitting: Family Medicine

## 2020-11-17 ENCOUNTER — Encounter: Payer: Self-pay | Admitting: Family Medicine

## 2020-11-17 ENCOUNTER — Other Ambulatory Visit: Payer: Self-pay

## 2020-11-17 VITALS — BP 124/82 | HR 107 | Ht 64.0 in | Wt 237.6 lb

## 2020-11-17 DIAGNOSIS — R7303 Prediabetes: Secondary | ICD-10-CM | POA: Diagnosis not present

## 2020-11-17 DIAGNOSIS — R29898 Other symptoms and signs involving the musculoskeletal system: Secondary | ICD-10-CM | POA: Diagnosis not present

## 2020-11-17 DIAGNOSIS — F419 Anxiety disorder, unspecified: Secondary | ICD-10-CM

## 2020-11-17 NOTE — Progress Notes (Deleted)
September through october, in the ED had an episode where her legs arms, and left face locked up when she wasin a wheel chair.    Legs giving out randomly a couple times a day due to weakness in anterior thigh muscles which has been going on since September. Sometimes she has tingling in her legs before the falling. Sometimes it is sharp pain before. Sometimes happens when she wakes up in the morning and her legs give out right away. Sometimes, she can be up walking around for 2-3 hours and then will need to start using her walker because she gets weak in her legs.  She reports she went to the gym in august in 2.5 weeks for four days a week and she did experience soreness and then in September this started happening.  When the episodes happen, she tries to stretch her legs and move her legs around. She reports she doesn't have any weakness when moving her legs around afterwards. She will sit down for 5-10 minutes before trying to get up again. Sometimes she still feels weak afterwards. She will use a walker or cane afterwards to make sure she is able to walk.  She reports that she has had this feeling of weakness in her upper arms as well, but has no issues brushing her hair or brushing her teeth. She has pain in her shoulders sometimes with brushing her hair, but denies any weakness.  No recent trauma.  Reports that all of this is getting better over the last few months.

## 2020-11-17 NOTE — Patient Instructions (Signed)
Prediabetes Prediabetes is the condition of having a blood sugar (blood glucose) level that is higher than it should be, but not high enough for you to be diagnosed with type 2 diabetes. Having prediabetes puts you at risk for developing type 2 diabetes (type 2 diabetes mellitus). Prediabetes may be called impaired glucose tolerance or impaired fasting glucose. Prediabetes usually does not cause symptoms. Your health care provider can diagnose this condition with blood tests. You may be tested for prediabetes if you are overweight and if you have at least one other risk factor for prediabetes. What is blood glucose, and how is it measured? Blood glucose refers to the amount of glucose in your bloodstream. Glucose comes from eating foods that contain sugars and starches (carbohydrates), which the body breaks down into glucose. Your blood glucose level may be measured in mg/dL (milligrams per deciliter) or mmol/L (millimoles per liter). Your blood glucose may be checked with one or more of the following blood tests:  A fasting blood glucose (FBG) test. You will not be allowed to eat (you will fast) for 8 hours or longer before a blood sample is taken. ? A normal range for FBG is 70-100 mg/dl (3.9-5.6 mmol/L).  An A1c (hemoglobin A1c) blood test. This test provides information about blood glucose control over the previous 2?3months.  An oral glucose tolerance test (OGTT). This test measures your blood glucose at two times: ? After fasting. This is your baseline level. ? Two hours after you drink a beverage that contains glucose. You may be diagnosed with prediabetes:  If your FBG is 100?125 mg/dL (5.6-6.9 mmol/L).  If your A1c level is 5.7?6.4%.  If your OGTT result is 140?199 mg/dL (7.8-11 mmol/L). These blood tests may be repeated to confirm your diagnosis. How can this condition affect me? The pancreas produces a hormone (insulin) that helps to move glucose from the bloodstream into cells.  When cells in the body do not respond properly to insulin that the body makes (insulin resistance), excess glucose builds up in the blood instead of going into cells. As a result, high blood glucose (hyperglycemia) can develop, which can cause many complications. Hyperglycemia is a symptom of prediabetes. Having high blood glucose for a long time is dangerous. Too much glucose in your blood can damage your nerves and blood vessels. Long-term damage can lead to complications from diabetes, which may include:  Heart disease.  Stroke.  Blindness.  Kidney disease.  Depression.  Poor circulation in the feet and legs, which could lead to surgical removal (amputation) in severe cases. What can increase my risk? Risk factors for prediabetes include:  Having a family member with type 2 diabetes.  Being overweight or obese.  Being older than age 45.  Being of American Indian, African-American, Hispanic/Latino, or Asian/Pacific Islander descent.  Having an inactive (sedentary) lifestyle.  Having a history of heart disease.  History of gestational diabetes or polycystic ovary syndrome (PCOS), in women.  Having low levels of good cholesterol (HDL-C) or high levels of blood fats (triglycerides).  Having high blood pressure. What actions can I take to prevent diabetes?      Be physically active. ? Do moderate-intensity physical activity for 30 or more minutes on 5 or more days of the week, or as much as told by your health care provider. This could be brisk walking, biking, or water aerobics. ? Ask your health care provider what activities are safe for you. A mix of physical activities may be best, such as   walking, swimming, cycling, and strength training.  Lose weight as told by your health care provider. ? Losing 5-7% of your body weight can reverse insulin resistance. ? Your health care provider can determine how much weight loss is best for you and can help you lose weight  safely.  Follow a healthy meal plan. This includes eating lean proteins, complex carbohydrates, fresh fruits and vegetables, low-fat dairy products, and healthy fats. ? Follow instructions from your health care provider about eating or drinking restrictions. ? Make an appointment to see a diet and nutrition specialist (registered dietitian) to help you create a healthy eating plan that is right for you.  Do not smoke or use any tobacco products, such as cigarettes, chewing tobacco, and e-cigarettes. If you need help quitting, ask your health care provider.  Take over-the-counter and prescription medicines as told by your health care provider. You may be prescribed medicines that help lower the risk of type 2 diabetes.  Keep all follow-up visits as told by your health care provider. This is important. Summary  Prediabetes is the condition of having a blood sugar (blood glucose) level that is higher than it should be, but not high enough for you to be diagnosed with type 2 diabetes.  Having prediabetes puts you at risk for developing type 2 diabetes (type 2 diabetes mellitus).  To help prevent type 2 diabetes, make lifestyle changes such as being physically active and eating a healthy diet. Lose weight as told by your health care provider. This information is not intended to replace advice given to you by your health care provider. Make sure you discuss any questions you have with your health care provider. Document Revised: 03/30/2019 Document Reviewed: 01/27/2016 Elsevier Patient Education  2020 Elsevier Inc.  Prediabetes Eating Plan Prediabetes is a condition that causes blood sugar (glucose) levels to be higher than normal. This increases the risk for developing diabetes. In order to prevent diabetes from developing, your health care provider may recommend a diet and other lifestyle changes to help you:  Control your blood glucose levels.  Improve your cholesterol levels.  Manage your  blood pressure. Your health care provider may recommend working with a diet and nutrition specialist (dietitian) to make a meal plan that is best for you. What are tips for following this plan? Lifestyle  Set weight loss goals with the help of your health care team. It is recommended that most people with prediabetes lose 7% of their current body weight.  Exercise for at least 30 minutes at least 5 days a week.  Attend a support group or seek ongoing support from a mental health counselor.  Take over-the-counter and prescription medicines only as told by your health care provider. Reading food labels  Read food labels to check the amount of fat, salt (sodium), and sugar in prepackaged foods. Avoid foods that have: ? Saturated fats. ? Trans fats. ? Added sugars.  Avoid foods that have more than 300 milligrams (mg) of sodium per serving. Limit your daily sodium intake to less than 2,300 mg each day. Shopping  Avoid buying pre-made and processed foods. Cooking  Cook with olive oil. Do not use butter, lard, or ghee.  Bake, broil, grill, or boil foods. Avoid frying. Meal planning   Work with your dietitian to develop an eating plan that is right for you. This may include: ? Tracking how many calories you take in. Use a food diary, notebook, or mobile application to track what you eat at each  Using the glycemic index (GI) to plan your meals. The index tells you how quickly a food will raise your blood glucose. Choose low-GI foods. These foods take a longer time to raise blood glucose.  Consider following a Mediterranean diet. This diet includes: ? Several servings each day of fresh fruits and vegetables. ? Eating fish at least twice a week. ? Several servings each day of whole grains, beans, nuts, and seeds. ? Using olive oil instead of other fats. ? Moderate alcohol consumption. ? Eating small amounts of red meat and whole-fat dairy.  If you have high blood pressure, you  may need to limit your sodium intake or follow a diet such as the DASH eating plan. DASH is an eating plan that aims to lower high blood pressure. What foods are recommended? The items listed below may not be a complete list. Talk with your dietitian about what dietary choices are best for you. Grains Whole grains, such as whole-wheat or whole-grain breads, crackers, cereals, and pasta. Unsweetened oatmeal. Bulgur. Barley. Quinoa. Brown rice. Corn or whole-wheat flour tortillas or taco shells. Vegetables Lettuce. Spinach. Peas. Beets. Cauliflower. Cabbage. Broccoli. Carrots. Tomatoes. Squash. Eggplant. Herbs. Peppers. Onions. Cucumbers. Brussels sprouts. Fruits Berries. Bananas. Apples. Oranges. Grapes. Papaya. Mango. Pomegranate. Kiwi. Grapefruit. Cherries. Meats and other protein foods Seafood. Poultry without skin. Lean cuts of pork and beef. Tofu. Eggs. Nuts. Beans. Dairy Low-fat or fat-free dairy products, such as yogurt, cottage cheese, and cheese. Beverages Water. Tea. Coffee. Sugar-free or diet soda. Seltzer water. Lowfat or no-fat milk. Milk alternatives, such as soy or almond milk. Fats and oils Olive oil. Canola oil. Sunflower oil. Grapeseed oil. Avocado. Walnuts. Sweets and desserts Sugar-free or low-fat pudding. Sugar-free or low-fat ice cream and other frozen treats. Seasoning and other foods Herbs. Sodium-free spices. Mustard. Relish. Low-fat, low-sugar ketchup. Low-fat, low-sugar barbecue sauce. Low-fat or fat-free mayonnaise. What foods are not recommended? The items listed below may not be a complete list. Talk with your dietitian about what dietary choices are best for you. Grains Refined white flour and flour products, such as bread, pasta, snack foods, and cereals. Vegetables Canned vegetables. Frozen vegetables with butter or cream sauce. Fruits Fruits canned with syrup. Meats and other protein foods Fatty cuts of meat. Poultry with skin. Breaded or fried meat.  Processed meats. Dairy Full-fat yogurt, cheese, or milk. Beverages Sweetened drinks, such as sweet iced tea and soda. Fats and oils Butter. Lard. Ghee. Sweets and desserts Baked goods, such as cake, cupcakes, pastries, cookies, and cheesecake. Seasoning and other foods Spice mixes with added salt. Ketchup. Barbecue sauce. Mayonnaise. Summary  To prevent diabetes from developing, you may need to make diet and other lifestyle changes to help control blood sugar, improve cholesterol levels, and manage your blood pressure.  Set weight loss goals with the help of your health care team. It is recommended that most people with prediabetes lose 7 percent of their current body weight.  Consider following a Mediterranean diet that includes plenty of fresh fruits and vegetables, whole grains, beans, nuts, seeds, fish, lean meat, low-fat dairy, and healthy oils. This information is not intended to replace advice given to you by your health care provider. Make sure you discuss any questions you have with your health care provider. Document Revised: 03/30/2019 Document Reviewed: 02/09/2017 Elsevier Patient Education  2020 Else[employer] Inc.  

## 2020-11-17 NOTE — Progress Notes (Signed)
    SUBJECTIVE:  CHIEF COMPLAINT / HPI:   Leg weakness  Legs giving out randomly a couple times a day due to weakness in anterior thigh muscles which has been going on since September. Sometimes she has tingling in her legs before the falling. Sometimes it is sharp pain before. Sometimes happens when she wakes up in the morning and her legs give out right away. Sometimes, she can be up walking around for 2-3 hours and then will need to start using her walker because she gets weak in her legs.  She reports she went to the gym in august in 2.5 weeks for four days a week and she did experience soreness and then in September this started happening.  When the episodes happen, she tries to stretch her legs and move her legs around. She reports she doesn't have any weakness when moving her legs around afterwards. She will sit down for 5-10 minutes before trying to get up again. Sometimes she still feels weak afterwards. She will use a walker or cane afterwards to make sure she is able to walk.  She reports that she has had this feeling of weakness in her upper arms as well, but has no issues brushing her hair or brushing her teeth. She has pain in her shoulders sometimes with brushing her hair, but denies any weakness.  No recent trauma.  Reports that all of this is getting better over the last few months.   Anxiety Started on lexapro with Dr. Salvadore Dom. She is start therapy tomorrow. Following up with Dr. Salvadore Dom in one month   Prediabetes Patient would like to discuss further implications of prediabetes and counseling  PERTINENT  PMH / PSH: anxiety, dizziness. No hx of neuromuscular disease.  OBJECTIVE:  BP 124/82   Pulse (!) 107   Ht 5\' 4"  (1.626 m)   Wt 237 lb 9.6 oz (107.8 kg)   SpO2 94%   BMI 40.78 kg/m   Gen: Sitting on exam comfortably.  Lower extremities:   . No obvious atrophy, asymetry, gross abnormality  . No tenderness at bony prominences or major muscle groups.   . Full  range of motion with both active and passive hip flexion/extension, internal/external rotation, adduction/abduction, knee extension/flexion, ankle plantar/dorsiflexion bilaterally.   . 5 out of 5 strength in large muscle groups of lower extremities bilaterally.   . No sensory deficits bilaterally.   around clinic with no obvious gait deficits. Uses cane in right hand and does not appear to require much support.   ASSESSMENT/PLAN:  Weakness of both lower extremities Unclear etiology.  Neuromuscular and vascular exam within normal limits.  Pattern presenting illness is not consistent with neuromuscular disease such as myasthenia gravis.  Patient denies any falls.  Reassuring that she is able to stretch and move her legs around after these.'s of weakness.  Patient is agreeable to work with physical therapy for strengthening muscles.  We will follow-up with patient in 1 month.  Prediabetes Counseled patient on lifestyle changes such as diet and exercise.  Also provided handout.  Answered questions.  Anxiety Patient tolerating medication well at this time. Has follow up arranged.     Tamara Cahill, MD [facility]

## 2020-11-18 ENCOUNTER — Telehealth: Payer: Self-pay | Admitting: Family Medicine

## 2020-11-18 ENCOUNTER — Encounter: Payer: Self-pay | Admitting: Family Medicine

## 2020-11-18 DIAGNOSIS — R29898 Other symptoms and signs involving the musculoskeletal system: Secondary | ICD-10-CM

## 2020-11-18 DIAGNOSIS — F410 Panic disorder [episodic paroxysmal anxiety] without agoraphobia: Secondary | ICD-10-CM | POA: Diagnosis not present

## 2020-11-18 HISTORY — DX: Other symptoms and signs involving the musculoskeletal system: R29.898

## 2020-11-18 NOTE — Assessment & Plan Note (Signed)
Patient tolerating medication well at this time. Has follow up arranged.

## 2020-11-18 NOTE — Assessment & Plan Note (Signed)
Counseled patient on lifestyle changes such as diet and exercise.  Also provided handout.  Answered questions.

## 2020-11-18 NOTE — Assessment & Plan Note (Signed)
Unclear etiology.  Neuromuscular and vascular exam within normal limits.  Pattern presenting illness is not consistent with neuromuscular disease such as myasthenia gravis.  Patient denies any falls.  Reassuring that she is able to stretch and move her legs around after these.'s of weakness.  Patient is agreeable to work with physical therapy for strengthening muscles.  We will follow-up with patient in 1 month.

## 2020-11-18 NOTE — Telephone Encounter (Signed)
Discussed lab results informing patient that she is pre-diabetic. Appointment on 11/24/2020 we will discuss lifestyle changes. Patient stated medication is working for her anxiety.   Tamara Lubin Autry-Lott, DO 11/18/2020, 11:10 AM PGY-2, [facility]
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
## 2020-11-24 ENCOUNTER — Encounter: Payer: Self-pay | Admitting: Family Medicine

## 2020-11-24 ENCOUNTER — Ambulatory Visit (INDEPENDENT_AMBULATORY_CARE_PROVIDER_SITE_OTHER): Payer: Medicaid Other | Admitting: Family Medicine

## 2020-11-24 ENCOUNTER — Other Ambulatory Visit: Payer: Self-pay

## 2020-11-24 VITALS — BP 132/80 | HR 103 | Ht 64.0 in | Wt 238.2 lb

## 2020-11-24 DIAGNOSIS — R030 Elevated blood-pressure reading, without diagnosis of hypertension: Secondary | ICD-10-CM | POA: Diagnosis not present

## 2020-11-24 DIAGNOSIS — F419 Anxiety disorder, unspecified: Secondary | ICD-10-CM | POA: Diagnosis not present

## 2020-11-24 DIAGNOSIS — Z23 Encounter for immunization: Secondary | ICD-10-CM | POA: Diagnosis not present

## 2020-11-24 DIAGNOSIS — N939 Abnormal uterine and vaginal bleeding, unspecified: Secondary | ICD-10-CM

## 2020-11-24 MED ORDER — ESCITALOPRAM OXALATE 10 MG PO TABS: 10.0000 mg | ORAL_TABLET | Freq: Every day | ORAL | 0 refills | Status: DC

## 2020-11-24 MED ORDER — MEGESTROL ACETATE 40 MG PO TABS: 40.0000 mg | ORAL_TABLET | Freq: Two times a day (BID) | ORAL | 0 refills | Status: DC

## 2020-11-24 NOTE — Patient Instructions (Signed)
It was wonderful to see you today.  Please bring ALL of your medications with you to every visit.   Today we talked about:  Your level of anxiety has decreased.  This is wonderful.  Continue to take Lexapro 10 mg daily.  Follow-up with physical therapy and OB/GYN.  Please consider getting the flu shot.  Please call the clinic at [phone] if you have any concerns. It was our pleasure to serve you.  Dr. Salvadore Dom

## 2020-11-24 NOTE — Assessment & Plan Note (Signed)
Initial BP 142/82 HR 111. Improved prior to end of visit 132/80 HR 103. -Continue to monitor at home; discussed return precautions -Monitor with subsequent visits

## 2020-11-24 NOTE — Assessment & Plan Note (Addendum)
GAD 7 improved significantly. Patient voicing satisfaction with mood and decreasing anxiety. Lexapro started 2 weeks prior. Having several visits with this patient I appreciate an improvement in her mood. -Continue lexapro 10 mg daily; refilled today -Encouraged continued dual therapy -Follow up as needed

## 2020-11-24 NOTE — Progress Notes (Addendum)
    SUBJECTIVE:   CHIEF COMPLAINT / HPI:   Ms. Bouler is a 51 F who presents for the issue below.  Mood check Doing well. No concerns feel as if her current dose of lexapro is working. Continuing with solo therapy sessions and group therapy session once weekly. Sessions have been beneficial.   Elevated BP  Measures blood pressure at home and highest readings are 130/80 when she is up doing a lot of home cleaning.   HM Declines flu vaccine today. Will consider next visit.   PERTINENT  PMH / PSH: AUB (Gyn appt 12/01/20; needs refill of megace until then), LE weakness (PT starting 12/09/20)  OBJECTIVE:   BP 132/80   Pulse (!) 103   Ht 5\' 4"  (1.626 m)   Wt 238 lb 3.2 oz (108 kg)   SpO2 97%   BMI 40.89 kg/m   GAD 7 : Generalized Anxiety Score 11/24/2020 11/11/2020 10/27/2020 10/15/2020  Nervous, Anxious, on Edge 1 3 2 3   Control/stop worrying 0 2 1 1   Worry too much - different things 0 2 1 1   Trouble relaxing 0 2 1 1   Restless 0 1 1 0  Easily annoyed or irritable 0 1 0 0  Afraid - awful might happen 0 1 0 1  Total GAD 7 Score 1 12 6 7   Anxiety Difficulty - - - -    General: Appears well, no acute distress. Age appropriate. Cardiac: tachycardic regular rhythm, normal heart sounds, no murmurs Respiratory: CTAB, normal effort Psych: normal affect  ASSESSMENT/PLAN:   Anxiety GAD 7 improved significantly. Patient voicing satisfaction with mood and decreasing anxiety. Lexapro started 2 weeks prior. Having several visits with this patient I appreciate an improvement in her mood. -Continue lexapro 10 mg daily; refilled today -Encouraged continued dual therapy -Follow up as needed  Elevated BP without diagnosis of hypertension Initial BP 142/82 HR 111. Improved prior to end of visit 132/80 HR 103. -Continue to monitor at home; discussed return precautions -Monitor with subsequent visits  Need for vaccination -Declines flu vaccine  10/17/2020, DO [facility]
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
## 2020-11-27 DIAGNOSIS — F410 Panic disorder [episodic paroxysmal anxiety] without agoraphobia: Secondary | ICD-10-CM | POA: Diagnosis not present

## 2020-11-30 ENCOUNTER — Other Ambulatory Visit: Payer: Self-pay | Admitting: Family Medicine

## 2020-11-30 DIAGNOSIS — N939 Abnormal uterine and vaginal bleeding, unspecified: Secondary | ICD-10-CM

## 2020-12-01 ENCOUNTER — Ambulatory Visit (INDEPENDENT_AMBULATORY_CARE_PROVIDER_SITE_OTHER): Payer: Medicaid Other | Admitting: Obstetrics and Gynecology

## 2020-12-01 ENCOUNTER — Encounter: Payer: Self-pay | Admitting: Obstetrics and Gynecology

## 2020-12-01 ENCOUNTER — Other Ambulatory Visit: Payer: Self-pay

## 2020-12-01 ENCOUNTER — Other Ambulatory Visit (HOSPITAL_COMMUNITY)
Admission: RE | Admit: 2020-12-01 | Discharge: 2020-12-01 | Disposition: A | Discharge status: Home / Self Care | Payer: Medicaid Other | Source: Ambulatory Visit | Attending: Obstetrics and Gynecology | Admitting: Obstetrics and Gynecology

## 2020-12-01 VITALS — BP 121/77 | HR 104 | Ht 64.0 in | Wt 238.4 lb

## 2020-12-01 DIAGNOSIS — N939 Abnormal uterine and vaginal bleeding, unspecified: Secondary | ICD-10-CM | POA: Insufficient documentation

## 2020-12-01 NOTE — Progress Notes (Signed)
Tamara Mccarthy presents for eval of AUB. She has had some degree of bleeding daily since her LMP the end of August. She was started on Megace 40 mg bid and this has helped some  GYN U/S 9/21 revealed thicken endometrium  Cycles prior were regular, monthly and last 7 days  H/O C section x 4 H/O MAB x 4 H/O BTL  ? Early DM Pap 7/21 normal  PE AF VSS Lungs clear Heart RRR Abd soft + BS GU nl EGBUS cervix no lesions, scant blood noted, uterus < 10 weeks, mobile non tender, no masses  ENDOMETRIAL BIOPSY     The indications for endometrial biopsy were reviewed.   Risks of the biopsy including cramping, bleeding, infection, uterine perforation, inadequate specimen and need for additional procedures  were discussed. The patient states she understands and agrees to undergo procedure today. Consent was signed. Time out was performed. Urine HCG was negative. During the pelvic exam, the cervix was prepped with Betadine. A single-toothed tenaculum was placed on the anterior lip of the cervix to stabilize it. The 3 mm pipelle was introduced into the endometrial cavity without difficulty to a depth of 9cm, and a moderate amount of tissue was obtained and sent to pathology. The instruments were removed from the patient's vagina. Minimal bleeding from the cervix was noted. The patient tolerated the procedure well. Routine post-procedure instructions were given to the patient.     A/P AUB  S/P EMBX today. Continue with Megace for now. Await EMBX results.

## 2020-12-02 DIAGNOSIS — F410 Panic disorder [episodic paroxysmal anxiety] without agoraphobia: Secondary | ICD-10-CM | POA: Diagnosis not present

## 2020-12-03 ENCOUNTER — Other Ambulatory Visit: Payer: Self-pay | Admitting: Family Medicine

## 2020-12-03 DIAGNOSIS — F419 Anxiety disorder, unspecified: Secondary | ICD-10-CM

## 2020-12-03 LAB — SURGICAL PATHOLOGY

## 2020-12-03 MED ORDER — ESCITALOPRAM OXALATE 10 MG PO TABS: 10.0000 mg | ORAL_TABLET | Freq: Every day | ORAL | 0 refills | Status: DC

## 2020-12-05 ENCOUNTER — Other Ambulatory Visit: Payer: Self-pay | Admitting: *Deleted

## 2020-12-05 ENCOUNTER — Encounter: Payer: Self-pay | Admitting: Family Medicine

## 2020-12-05 DIAGNOSIS — N939 Abnormal uterine and vaginal bleeding, unspecified: Secondary | ICD-10-CM

## 2020-12-05 NOTE — Progress Notes (Signed)
Opened in error

## 2020-12-08 ENCOUNTER — Encounter: Payer: Self-pay | Admitting: *Deleted

## 2020-12-08 ENCOUNTER — Other Ambulatory Visit: Payer: Self-pay | Admitting: *Deleted

## 2020-12-08 DIAGNOSIS — N939 Abnormal uterine and vaginal bleeding, unspecified: Secondary | ICD-10-CM

## 2020-12-08 MED ORDER — MEGESTROL ACETATE 40 MG PO TABS: 40.0000 mg | ORAL_TABLET | Freq: Two times a day (BID) | ORAL | 0 refills | Status: DC

## 2020-12-08 NOTE — Telephone Encounter (Signed)
Called patient to discuss request for megace. Endometrial biopsy results reviewed.   Patient will be following up with Dr. Alysia Penna in early January.  She is requesting Megace to get her through the appointment. Looks like Dr. Alysia Penna sent in a 7 day supply for her today. Patient now aware and will pick up from pharmacy   Melene Plan, M.D.  3:02 PM 12/08/2020

## 2020-12-09 ENCOUNTER — Ambulatory Visit: Payer: Medicaid Other

## 2020-12-12 ENCOUNTER — Encounter: Payer: Self-pay | Admitting: Family Medicine

## 2020-12-12 DIAGNOSIS — N939 Abnormal uterine and vaginal bleeding, unspecified: Secondary | ICD-10-CM

## 2020-12-15 ENCOUNTER — Other Ambulatory Visit: Payer: Self-pay

## 2020-12-15 DIAGNOSIS — N939 Abnormal uterine and vaginal bleeding, unspecified: Secondary | ICD-10-CM

## 2020-12-15 MED ORDER — MEGESTROL ACETATE 40 MG PO TABS: 40.0000 mg | ORAL_TABLET | Freq: Two times a day (BID) | ORAL | 0 refills | Status: AC

## 2020-12-16 MED ORDER — MEGESTROL ACETATE 40 MG PO TABS: 40.0000 mg | ORAL_TABLET | Freq: Two times a day (BID) | ORAL | 0 refills | Status: DC

## 2020-12-18 ENCOUNTER — Other Ambulatory Visit: Payer: Self-pay

## 2020-12-18 ENCOUNTER — Ambulatory Visit: Payer: Medicaid Other | Admitting: Family Medicine

## 2020-12-18 ENCOUNTER — Encounter: Payer: Self-pay | Admitting: Family Medicine

## 2020-12-18 VITALS — BP 110/80 | HR 89 | Ht 64.0 in | Wt 241.2 lb

## 2020-12-18 DIAGNOSIS — R7303 Prediabetes: Secondary | ICD-10-CM | POA: Diagnosis not present

## 2020-12-18 DIAGNOSIS — F419 Anxiety disorder, unspecified: Secondary | ICD-10-CM

## 2020-12-18 DIAGNOSIS — R29898 Other symptoms and signs involving the musculoskeletal system: Secondary | ICD-10-CM

## 2020-12-18 DIAGNOSIS — E559 Vitamin D deficiency, unspecified: Secondary | ICD-10-CM | POA: Diagnosis not present

## 2020-12-18 NOTE — Progress Notes (Signed)
   SUBJECTIVE:  CHIEF COMPLAINT / HPI:   Anxiety Going to therapy weekly and adherent to medication rx'ed by Dr. Salvadore Dom. Reports that she is doing very well. GAD 7 and PHQ 9 today is 0.   Hx of Iron deficiency  Patient reports history of iron deficiency and wants to review previous results. Normal hemoglobin recently; however, ferritin in 06/2020 8.   Pre diabetes  6.4 on 11/23  Return on February 20th to recheck A1C.   Leg Complains  Reports that this is feeling improved. She still plans on going to PT appointment.   Hx of Vitamin D Deficiency.  Will recheck today   PERTINENT  PMH / PSH: prediabetes, Vit D deficiency, anxiety   OBJECTIVE:  BP 110/80   Pulse 89   Ht 5\' 4"  (1.626 m)   Wt 241 lb 3.2 oz (109.4 kg)   SpO2 96%   BMI 41.40 kg/m   General: well appearing female, NAD  Psych: Patient is well groomed with good hygiene.  Speech is normal with normal rate, latency, volume and intonation.  Behavior is normal with normal eye contact.  Patient is friendly, cooperative.  Tight and logical thought process is.  No abnormal thought content.  Mood is good with appropriate affect and range of emotion.  ASSESSMENT/PLAN:  Vitamin D deficiency Completed 8 weeks of supplementation. Recheck Vit D today   Weakness of both lower extremities Much improved. Will still plan to go to PT for initial appt  Prediabetes Return in late February to recheck a1c. Diet and exercise recommendations.   Anxiety Much improved with medication and therapy. GAD 7 and PHQ9 both 0 today. Patient appears to be doing very well.     [place], MD [facility]

## 2020-12-18 NOTE — Patient Instructions (Signed)
I will call you with results for Vitamin D.  Come back in late February to recheck your A1C.  I am so glad that you are doing well!! :)

## 2020-12-19 LAB — VITAMIN D 25 HYDROXY (VIT D DEFICIENCY, FRACTURES): Vit D, 25-Hydroxy: 33.9 ng/mL (ref 30.0–100.0)

## 2020-12-20 ENCOUNTER — Encounter: Payer: Self-pay | Admitting: Family Medicine

## 2020-12-20 NOTE — Assessment & Plan Note (Signed)
Return in late February to recheck a1c. Diet and exercise recommendations.

## 2020-12-20 NOTE — Assessment & Plan Note (Signed)
Completed 8 weeks of supplementation. Recheck Vit D today

## 2020-12-20 NOTE — Assessment & Plan Note (Signed)
Much improved. Will still plan to go to PT for initial appt

## 2020-12-20 NOTE — Assessment & Plan Note (Signed)
Much improved with medication and therapy. GAD 7 and PHQ9 both 0 today. Patient appears to be doing very well.

## 2020-12-22 ENCOUNTER — Ambulatory Visit: Payer: Medicaid Other | Admitting: Obstetrics and Gynecology

## 2020-12-22 ENCOUNTER — Other Ambulatory Visit: Payer: Self-pay | Admitting: *Deleted

## 2020-12-22 NOTE — Progress Notes (Signed)
Opened in error

## 2020-12-23 DIAGNOSIS — F410 Panic disorder [episodic paroxysmal anxiety] without agoraphobia: Secondary | ICD-10-CM | POA: Diagnosis not present

## 2020-12-24 ENCOUNTER — Ambulatory Visit: Payer: Medicaid Other | Attending: Family Medicine

## 2020-12-24 ENCOUNTER — Ambulatory Visit (INDEPENDENT_AMBULATORY_CARE_PROVIDER_SITE_OTHER): Payer: Medicaid Other | Admitting: Obstetrics and Gynecology

## 2020-12-24 ENCOUNTER — Other Ambulatory Visit: Payer: Self-pay

## 2020-12-24 ENCOUNTER — Encounter: Payer: Self-pay | Admitting: Obstetrics and Gynecology

## 2020-12-24 VITALS — BP 134/86 | HR 104 | Ht 64.0 in | Wt 242.0 lb

## 2020-12-24 DIAGNOSIS — M6281 Muscle weakness (generalized): Secondary | ICD-10-CM | POA: Diagnosis not present

## 2020-12-24 DIAGNOSIS — N939 Abnormal uterine and vaginal bleeding, unspecified: Secondary | ICD-10-CM | POA: Diagnosis not present

## 2020-12-24 DIAGNOSIS — R262 Difficulty in walking, not elsewhere classified: Secondary | ICD-10-CM

## 2020-12-24 MED ORDER — NORETHINDRONE ACETATE 5 MG PO TABS: 5.0000 mg | ORAL_TABLET | Freq: Every day | ORAL | 2 refills | Status: DC

## 2020-12-24 NOTE — Progress Notes (Signed)
Pt. States has been clotting w/ heavy bleeding & dizziness. Feels week. Is currently taking Iron.

## 2020-12-24 NOTE — Progress Notes (Signed)
Ms Dano presents for follow up of her AUB. Bleeding has slowed down some with Megace but not much EMBX normal, reviewed with pt. U/S 9/21 normal Feels tired Tx options reviewed with pt, desires definite therapy  PE AF VSS Lungs clear Heart RRR Abd soft + BS GU Nl EGBUS, uterus < 10 weeks mobile  A/P AUB TVH reviewed with pt. R/B/Post op care discussed. Information provided to pt. Hysterectomy papers signed today. Will d/c Megace and start Aygestin qd. Will check CBC. Follow up with post op appt.

## 2020-12-24 NOTE — Progress Notes (Signed)
Hysterectomy Statement signed-12/24/2020

## 2020-12-24 NOTE — Patient Instructions (Signed)
Vaginal Hysterectomy, Care After Refer to this sheet in the next few weeks. These instructions provide you with information about caring for yourself after your procedure. Your health care provider may also give you more specific instructions. Your treatment has been planned according to current medical practices, but problems sometimes occur. Call your health care provider if you have any problems or questions after your procedure. What can I expect after the procedure? After the procedure, it is common to have:  Pain.  Soreness and numbness in your incision areas.  Vaginal bleeding and discharge.  Constipation.  Temporary problems emptying the bladder.  Feelings of sadness or other emotions. Follow these instructions at home: Medicines  Take over-the-counter and prescription medicines only as told by your health care provider.  If you were prescribed an antibiotic medicine, take it as told by your health care provider. Do not stop taking the antibiotic even if you start to feel better.  Do not drive or operate heavy machinery while taking prescription pain medicine. Activity  Return to your normal activities as told by your health care provider. Ask your health care provider what activities are safe for you.  Get regular exercise as told by your health care provider. You may be told to take short walks every day and go farther each time.  Do not lift anything that is heavier than 10 lb (4.5 kg). General instructions   Do not put anything in your vagina for 6 weeks after your surgery or as told by your health care provider. This includes tampons and douches.  Do not have sex until your health care provider says you can.  Do not take baths, swim, or use a hot tub until your health care provider approves.  Drink enough fluid to keep your urine clear or pale yellow.  Do not drive for 24 hours if you were given a sedative.  Keep all follow-up visits as told by your health  care provider. This is important. Contact a health care provider if:  Your pain medicine is not helping.  You have a fever.  You have redness, swelling, or pain at your incision site.  You have blood, pus, or a bad-smelling discharge from your vagina.  You continue to have difficulty urinating. Get help right away if:  You have severe abdominal or back pain.  You have heavy bleeding from your vagina.  You have chest pain or shortness of breath. This information is not intended to replace advice given to you by your health care provider. Make sure you discuss any questions you have with your health care provider. Document Revised: 07/29/2016 Document Reviewed: 12/21/2015 Elsevier Patient Education  2020 Elsevier Inc. Vaginal Hysterectomy  A vaginal hysterectomy is a procedure to remove all or part of the uterus through a small incision in the vagina. In this procedure, your health care provider may remove your entire uterus, including the lower end (cervix). You may need a vaginal hysterectomy to treat:  Uterine fibroids.  A condition that causes the lining of the uterus to grow in other areas (endometriosis).  Problems with pelvic support.  Cancer of the cervix, ovaries, uterus, or tissue that lines the uterus (endometrium).  Excessive (dysfunctional) uterine bleeding. When removing your uterus, your health care provider may also remove the organs that produce eggs (ovaries) and the tubes that carry eggs to your uterus (fallopian tubes). After a vaginal hysterectomy, you will no longer be able to have a baby. You will also no longer get your   menstrual period. Tell a health care provider about:  Any allergies you have.  All medicines you are taking, including vitamins, herbs, eye drops, creams, and over-the-counter medicines.  Any problems you or family members have had with anesthetic medicines.  Any blood disorders you have.  Any surgeries you have had.  Any medical  conditions you have.  Whether you are pregnant or may be pregnant. What are the risks? Generally, this is a safe procedure. However, problems may occur, including:  Bleeding.  Infection.  A blood clot that forms in your leg and travels to your lungs (pulmonary embolism).  Damage to surrounding organs.  Pain during sex. What happens before the procedure?  Ask your health care provider what organs will be removed during surgery.  Ask your health care provider about: ? Changing or stopping your regular medicines. This is especially important if you are taking diabetes medicines or blood thinners. ? Taking medicines such as aspirin and ibuprofen. These medicines can thin your blood. Do not take these medicines before your procedure if your health care provider instructs you not to.  Follow instructions from your health care provider about eating or drinking restrictions.  Do not use any tobacco products, such as cigarettes, chewing tobacco, and e-cigarettes. If you need help quitting, ask your health care provider.  Plan to have someone take you home after discharge from the hospital. What happens during the procedure?  To reduce your risk of infection: ? Your health care team will wash or sanitize their hands. ? Your skin will be washed with soap.  An IV tube will be inserted into one of your veins.  You may be given antibiotic medicine to help prevent infection.  You will be given one or more of the following: ? A medicine to help you relax (sedative). ? A medicine to numb the area (local anesthetic). ? A medicine to make you fall asleep (general anesthetic). ? A medicine that is injected into an area of your body to numb everything beyond the injection site (regional anesthetic).  Your surgeon will make an incision in your vagina.  Your surgeon will locate and remove all or part of your uterus.  Your ovaries and fallopian tubes may be removed at the same time.  The  incision will be closed with stitches (sutures) that dissolve over time. The procedure may vary among health care providers and hospitals. What happens after the procedure?  Your blood pressure, heart rate, breathing rate, and blood oxygen level will be monitored often until the medicines you were given have worn off.  You will be encouraged to get up and walk around after a few hours to help prevent complications.  You may have IV tubes in place for a few days.  You will be given pain medicine as needed.  Do not drive for 24 hours if you were given a sedative. This information is not intended to replace advice given to you by your health care provider. Make sure you discuss any questions you have with your health care provider. Document Revised: 07/29/2016 Document Reviewed: 12/21/2015 Elsevier Patient Education  2020 Elsevier Inc.  

## 2020-12-24 NOTE — Therapy (Signed)
[facility] [address] [place], [place], [address] Phone: [phone]   Fax:  [phone]  Physical Therapy Evaluation  Patient Details  Name: Tamara Mccarthy MRN: [id number] Date of Birth: 1981-07-29 Referring Provider (PT): Moses Manners, MD   Encounter Date: 12/24/2020   PT End of Session - 12/24/20 1857    Visit Number 1    Number of Visits 9    Date for PT Re-Evaluation 02/21/21    Authorization Type [employer] - No traction, ESTIM unattended, VASO. Submitted auth 12/24/2020    PT Start Time 1220    PT Stop Time 1305    PT Time Calculation (min) 45 min    Activity Tolerance Patient tolerated treatment well    Behavior During Therapy WFL for tasks assessed/performed           Past Medical History:  Diagnosis Date  . Acid reflux 2009  . Anxiety 2004  . Asthma 2013  . High cholesterol 2020  . Hypertension 2020    Past Surgical History:  Procedure Laterality Date  . CESAREAN SECTION  2002  . CESAREAN SECTION  2008  . CESAREAN SECTION  2011  . CESAREAN SECTION  2013  . HERNIA REPAIR  2005   [place],     There were no vitals filed for this visit.    Subjective Assessment - 12/24/20 1224    Subjective "Around August of last year, I was in the gym as part of my classes at the community college - I was in the same gym for 50 minutes/day for 4 days for two different classes. I noticed I was getting sore and stayed that way for 3 weeks, and I had to quit the class. The pain went away days later. In September I was walking and my arms and legs froze up. I live in a shelter, and they had to put me in a wheelchair. My arms and legs were frozen straight out, and I couldn't bend my legs or elbows. They gave me medicine for muscle spasm. From September to November, I was in a wheelchair. I was using the walker and use the cane now. My legs used to give out everyday but it has been less since December 2021.     Pertinent History See PMH above    Limitations Walking;House hold activities    How long can you sit comfortably? Used to have issues but not now    How long can you stand comfortably? Used to have issues but not now    How long can you walk comfortably? "about 1 hour"    Patient Stated Goals Driving, playing with kids (playing catch and other activities), being able to walk without assistive device    Currently in Pain? No/denies    Pain Onset More than a month ago    Pain Frequency Intermittent    Aggravating Factors  Prolonged walking but pt explains she has more fatigue than pain    Pain Relieving Factors Stretching, resting, massage    Effect of Pain on Daily Activities Difficulty playing with her kids, driving, and walking              [facility] PT Assessment - 12/24/20 0001      Assessment   Medical Diagnosis Weakness of both lower extremities (D22.025)    Referring Provider (PT) Moses Manners, MD    Onset Date/Surgical Date --   August 2021   Hand Dominance Right  Next MD Visit Nothing scheduled    Prior Therapy No      Precautions   Precautions None      Restrictions   Weight Bearing Restrictions No      Balance Screen   Has the patient fallen in the past 6 months No    Has the patient had a decrease in activity level because of a fear of falling?  Yes    Is the patient reluctant to leave their home because of a fear of falling?  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    Leisure Traveling, going to the park, going to the arcade, any adventures      Cognition   Overall Cognitive Status Within Functional Limits for tasks assessed      Observation/Other Assessments   Focus on Therapeutic Outcomes (FOTO)   No FOTO - MCD      ROM / Strength   AROM / PROM / Strength AROM;Strength;PROM      AROM   Overall AROM Comments Gross BLE AROM WFL without pain      PROM   Overall PROM Comments Gross PROM WFL with pain only with combined hip FL and ER (FABER position) primarily in R hip      Strength   Strength Assessment Site Hip;Knee;Ankle    Right/Left Hip Right;Left    Right Hip Flexion 4/5    Right Hip Extension 4/5    Right Hip ABduction 4/5    Right Hip ADduction 4/5    Left Hip Flexion 4/5    Left Hip Extension 4/5    Left Hip ABduction 4/5    Left Hip ADduction 4/5    Right/Left Knee Right;Left    Right Knee Flexion 4+/5    Right Knee Extension 4+/5    Left Knee Flexion 4+/5    Left Knee Extension 4+/5    Right/Left Ankle Right;Left    Right Ankle Dorsiflexion 4/5    Right Ankle Plantar Flexion 4/5   modified test in sitting   Left Ankle Dorsiflexion 4/5    Left Ankle Plantar Flexion 4/5   modified test in sitting     Flexibility   Soft Tissue Assessment /Muscle Length yes    Hamstrings Decreased flexibility noted    ITB Decreased flexibility noted      Palpation   Palpation comment TTP along B IT band, TFL, and proximal hip FL bilaterally (R > L)                      Objective measurements completed on examination: See above findings.       [facility] PT Treatment/Exercise - 12/24/20 0001      Self-Care   Self-Care Other Self-Care Comments    Other Self-Care Comments  HEP, anatomy of condition, hip/LE and core strengthening for increased stability, use of roller and/or tennis ball for self STM and myofascial release, TPDN and provided handout for more information, POC      Exercises   Exercises Knee/Hip      Knee/Hip Exercises: Stretches   Passive Hamstring Stretch Both;30 seconds    Passive Hamstring Stretch Limitations with green strap  Hip Flexor Stretch Limitations Thomas stretch RLE 2 x 30 sec    ITB Stretch Both;30 seconds    ITB Stretch  Limitations with green strap      Knee/Hip Exercises: Supine   Other Supine Knee/Hip Exercises Hooklying clamshells with red theraband x 10 each LE      Manual Therapy   Manual Therapy Soft tissue mobilization;Myofascial release    Soft tissue mobilization STM, myofascial release, and roller along B IT band, TFL, and proximal hip FL                  PT Education - 12/24/20 1444    Education Details HEP, anatomy of condition, hip/LE and core strengthening for increased stability, use of roller and/or tennis ball for self STM and myofascial release, TPDN and provided handout for more information, POC    Person(s) Educated Patient    Methods Explanation;Demonstration;Tactile cues;Verbal cues;Handout    Comprehension Verbalized understanding;Returned demonstration;Verbal cues required;Tactile cues required;Need further instruction            PT Short Term Goals - 12/24/20 1907      PT SHORT TERM GOAL #1   Title Patient will be independent with initial HEP.    Baseline Provided initial HEP at evaluation 12/24/2020.    Time 4    Period Weeks    Status New    Target Date 01/21/21      PT SHORT TERM GOAL #2   Title Patient will increase B hip MMT to at least 4+/5.    Baseline 4/5    Time 4    Period Weeks    Status New    Target Date 01/21/21      PT SHORT TERM GOAL #3   Title Patient will report at least 25% improvement in symptoms and at least one week without hips giving out.    Baseline Pt reports frequent fatigue in LE and feels like legs will give out after prolonged walking    Time 4    Period Weeks    Status New    Target Date 01/21/21      PT SHORT TERM GOAL #4   Title Will add goal about TUG and/or once assessed.             PT Long Term Goals - 12/24/20 1912      PT LONG TERM GOAL #1   Title Patient will be independent with initial HEP.    Baseline Provided initial HEP at evaluation 12/24/2020.    Time 8    Period Weeks    Status New     Target Date 02/18/21      PT LONG TERM GOAL #2   Title Patient will increase B hip MMT to 5/5.    Baseline 4/5    Time 8    Period Weeks    Status New    Target Date 02/18/21      PT LONG TERM GOAL #3   Title Patient will report at least 25% improvement in symptoms and at least one week without hips giving out.    Baseline Pt reports frequent fatigue in LE and feels like legs will give out after prolonged walking    Time 8    Period Weeks    Status New    Target Date 02/18/21      PT LONG TERM GOAL #4   Title Will add goal about TUG and/or once assessed.      PT  LONG TERM GOAL #5   Title Patient will be able to play outside with her kids for at least 1.5 hours without significant fatigue or pain.    Baseline Pt has difficulty playing with her kids currently    Time 8    Period Weeks    Status New    Target Date 02/18/21                  Plan - 12/24/20 1431    Clinical Impression Statement Patient is a 40 year old female who presents to OPPT with referral of bilateral lower extremity weakness. Pt reports significant improvement since onset of symptoms in August 2021, but she continues to have occasional episodes of increased fatigue in BLE to where she feels that her legs are going to give out. Pt demonstrates good strength grossly upon MMT assessment but explains having decreased functional strength and fatigue with prolonged walking or functional tasks. She also has difficulty with functional BUE motions occasionally, especially trying to play catch with her kids or reach overhead. Pt advised to request referral for UE and upper back symptoms from doctor as well if she desires. She should benefit from skilled PT intervention to address deficits and improve tolerance to daily activities and functional goals.    Personal Factors and Comorbidities Comorbidity 3+;Fitness;Past/Current Experience;Time since onset of injury/illness/exacerbation    Examination-Activity  Limitations Locomotion Level;Reach Overhead;Lift;Squat;Stairs;Bend    Examination-Participation Restrictions Community Activity;Driving;Laundry;Meal Prep;Shop;Cleaning    Stability/Clinical Decision Making Stable/Uncomplicated    Clinical Decision Making Low    Rehab Potential Good    PT Frequency 1x / week    PT Duration 8 weeks    PT Treatment/Interventions ADLs/Self Care Home Management;Aquatic Therapy;Electrical Stimulation;Cryotherapy;Iontophoresis 4mg /ml Dexamethasone;Moist Heat;Ultrasound;Neuromuscular re-education;Balance training;Therapeutic exercise;Therapeutic activities;Functional mobility training;Stair training;Gait training;Patient/family education;Manual techniques;Dry needling;Energy conservation;Taping    PT Next Visit Plan Review HEP and update PRN, manual techniques as indicated (STM, myofascial release), LE and core strength, assess low back and balance (5xSTS and/or )    PT Home Exercise Plan - HS and IT band stretches with strap, Thomas stretch, hooklying clamshell with red theraband    Consulted and Agree with Plan of Care Patient           Patient will benefit from skilled therapeutic intervention in order to improve the following deficits and impairments:  Decreased activity tolerance,Decreased balance,Decreased mobility,Decreased strength,Decreased endurance,Decreased range of motion,Difficulty walking,Increased muscle spasms,Pain,Obesity  Visit Diagnosis: Muscle weakness (generalized)  Difficulty in walking, not elsewhere classified     Problem List Patient Active Problem List   Diagnosis Date Noted  . Weakness of both lower extremities 11/18/2020  . Prediabetes 11/12/2020  . Dizziness 10/07/2020  . Abnormal uterine bleeding (AUB) 08/23/2020  . Encounter for immunization 08/11/2020  . Anxiety 08/11/2020  . BMI 40.0-44.9, adult (HCC) 07/16/2020  . Vitamin D deficiency 07/16/2020  . Anemia 07/16/2020  . Herpes simplex virus (HSV) infection  07/16/2020   Check all possible CPT codes: 07/18/2020- Therapeutic Exercise, [phone]- Neuro Re-education, [phone] - Gait Training, [phone] - Manual Therapy, [phone] - Therapeutic Activities, [phone] - Self Care, [phone] - Electrical stimulation (Manual), [id number] - Iontophoresis, [id number] - Ultrasound, [id number] - Physical performance training and [id number] - Aquatic therapy          [id number], PT, DPT 12/24/20 7:15 PM  [facility] [address] [place], [place], [place] Phone: [phone]   Fax:  [phone]  Name: Tamara Mccarthy MRN: Jarvis Newcomer Date of Birth:  01/27/1981  

## 2020-12-25 LAB — CBC
Hematocrit: 42.4 % (ref 34.0–46.6)
Hemoglobin: 13.9 g/dL (ref 11.1–15.9)
MCH: 26.7 pg (ref 26.6–33.0)
MCHC: 32.8 g/dL (ref 31.5–35.7)
MCV: 81 fL (ref 79–97)
Platelets: 392 10*3/uL (ref 150–450)
RBC: 5.21 x10E6/uL (ref 3.77–5.28)
RDW: 15 % (ref 11.7–15.4)
WBC: 7.8 10*3/uL (ref 3.4–10.8)

## 2020-12-30 DIAGNOSIS — F410 Panic disorder [episodic paroxysmal anxiety] without agoraphobia: Secondary | ICD-10-CM | POA: Diagnosis not present

## 2021-01-07 ENCOUNTER — Ambulatory Visit: Payer: Medicaid Other

## 2021-01-08 ENCOUNTER — Other Ambulatory Visit: Payer: Self-pay | Admitting: Family Medicine

## 2021-01-14 ENCOUNTER — Ambulatory Visit: Payer: Medicaid Other | Admitting: Physical Therapy

## 2021-01-22 ENCOUNTER — Ambulatory Visit: Payer: Medicaid Other | Admitting: Physical Therapy

## 2021-01-25 NOTE — Progress Notes (Deleted)
    SUBJECTIVE:   CHIEF COMPLAINT / HPI:   IUD: wants an iud if she is going to get a hysterectomy??  Prediabetes: a1c.   PERTINENT  PMH / PSH: ***  OBJECTIVE:   There were no vitals taken for this visit.  ***  ASSESSMENT/PLAN:   No problem-specific Assessment & Plan notes found for this encounter.     Sandre Kitty, MD [facility]

## 2021-01-26 ENCOUNTER — Ambulatory Visit: Payer: Medicaid Other | Admitting: Family Medicine

## 2021-01-27 ENCOUNTER — Ambulatory Visit: Admit: 2021-01-27 | Payer: Medicaid Other | Admitting: Obstetrics and Gynecology

## 2021-01-27 SURGERY — HYSTERECTOMY, VAGINAL
Anesthesia: Choice | Laterality: Bilateral

## 2021-02-02 ENCOUNTER — Ambulatory Visit: Payer: Medicaid Other | Admitting: Family Medicine

## 2021-02-02 ENCOUNTER — Encounter: Payer: Self-pay | Admitting: Obstetrics and Gynecology

## 2021-02-02 ENCOUNTER — Encounter: Payer: Self-pay | Admitting: Family Medicine

## 2021-02-02 ENCOUNTER — Other Ambulatory Visit: Payer: Self-pay

## 2021-02-02 ENCOUNTER — Ambulatory Visit (INDEPENDENT_AMBULATORY_CARE_PROVIDER_SITE_OTHER): Payer: Medicaid Other | Admitting: Obstetrics and Gynecology

## 2021-02-02 VITALS — BP 112/82 | HR 109 | Ht 64.0 in | Wt 243.0 lb

## 2021-02-02 VITALS — BP 147/79 | HR 111 | Ht 64.0 in | Wt 244.6 lb

## 2021-02-02 DIAGNOSIS — Z5900 Homelessness unspecified: Secondary | ICD-10-CM | POA: Insufficient documentation

## 2021-02-02 DIAGNOSIS — R7303 Prediabetes: Secondary | ICD-10-CM | POA: Diagnosis not present

## 2021-02-02 DIAGNOSIS — N939 Abnormal uterine and vaginal bleeding, unspecified: Secondary | ICD-10-CM

## 2021-02-02 DIAGNOSIS — Z3202 Encounter for pregnancy test, result negative: Secondary | ICD-10-CM | POA: Diagnosis not present

## 2021-02-02 DIAGNOSIS — Z30430 Encounter for insertion of intrauterine contraceptive device: Secondary | ICD-10-CM

## 2021-02-02 DIAGNOSIS — D649 Anemia, unspecified: Secondary | ICD-10-CM | POA: Diagnosis not present

## 2021-02-02 HISTORY — DX: Homelessness unspecified: Z59.00

## 2021-02-02 LAB — POCT PREGNANCY, URINE: Preg Test, Ur: NEGATIVE

## 2021-02-02 LAB — POCT GLYCOSYLATED HEMOGLOBIN (HGB A1C): Hemoglobin A1C: 6.2 % — AB (ref 4.0–5.6)

## 2021-02-02 LAB — POCT HEMOGLOBIN: Hemoglobin: 11.7 g/dL (ref 11–14.6)

## 2021-02-02 MED ORDER — LEVONORGESTREL 19.5 MCG/DAY IU IUD: INTRAUTERINE_SYSTEM | Freq: Once | INTRAUTERINE | Status: AC

## 2021-02-02 NOTE — Assessment & Plan Note (Signed)
6.2 today, slight improvement from last time.  Discussed diet and exercise recommendations with patient.  Advised limiting sugar, starches.  Follow-up with PCP in 6 months.

## 2021-02-02 NOTE — Assessment & Plan Note (Signed)
Patient has an appointment later today with the gynecologist to get an IUD placed.  Has been compliant with the medications they prescribed for her bleeding.  Unable to get the hysterectomy at this time due to living in a homeless shelter and not having anyone to watch her children while she recovers.  Taking iron supplementation.  Hemoglobin was 11.7 today.  Advised patient to continue taking iron supplementation.

## 2021-02-02 NOTE — Patient Instructions (Addendum)
Your A1c was slightly improved to 6.2.  Your hemoglobin was 11.7.  This was normal.  Continue to take your iron due to your heavy menstrual bleeding.  Schedule an appointment with your PCP in approximately 6 months or sooner if needed.  Have a great day,  Frederic Jericho, MD  Prediabetes Eating Plan Prediabetes is a condition that causes blood sugar (glucose) levels to be higher than normal. This increases the risk for developing type 2 diabetes (type 2 diabetes mellitus). Working with a health care provider or nutrition specialist (dietitian) to make diet and lifestyle changes can help prevent the onset of diabetes. These changes may help you:  Control your blood glucose levels.  Improve your cholesterol levels.  Manage your blood pressure. What are tips for following this plan? Reading food labels  Read food labels to check the amount of fat, salt (sodium), and sugar in prepackaged foods. Avoid foods that have: ? Saturated fats. ? Trans fats. ? Added sugars.  Avoid foods that have more than 300 milligrams (mg) of sodium per serving. Limit your sodium intake to less than 2,300 mg each day. Shopping  Avoid buying pre-made and processed foods.  Avoid buying drinks with added sugar. Cooking  Cook with olive oil. Do not use butter, lard, or ghee.  Bake, broil, grill, steam, or boil foods. Avoid frying. Meal planning  Work with your dietitian to create an eating plan that is right for you. This may include tracking how many calories you take in each day. Use a food diary, notebook, or mobile application to track what you eat at each meal.  Consider following a Mediterranean diet. This includes: ? Eating several servings of fresh fruits and vegetables each day. ? Eating fish at least twice a week. ? Eating one serving each day of whole grains, beans, nuts, and seeds. ? Using olive oil instead of other fats. ? Limiting alcohol. ? Limiting red meat. ? Using nonfat or low-fat dairy  products.  Consider following a plant-based diet. This includes dietary choices that focus on eating mostly vegetables and fruit, grains, beans, nuts, and seeds.  If you have high blood pressure, you may need to limit your sodium intake or follow a diet such as the DASH (Dietary Approaches to Stop Hypertension) eating plan. The DASH diet aims to lower high blood pressure.   Lifestyle  Set weight loss goals with help from your health care team. It is recommended that most people with prediabetes lose 7% of their body weight.  Exercise for at least 30 minutes 5 or more days a week.  Attend a support group or seek support from a mental health counselor.  Take over-the-counter and prescription medicines only as told by your health care provider. What foods are recommended? Fruits Berries. Bananas. Apples. Oranges. Grapes. Papaya. Mango. Pomegranate. Kiwi. Grapefruit. Cherries. Vegetables Lettuce. Spinach. Peas. Beets. Cauliflower. Cabbage. Broccoli. Carrots. Tomatoes. Squash. Eggplant. Herbs. Peppers. Onions. Cucumbers. Brussels sprouts. Grains Whole grains, such as whole-wheat or whole-grain breads, crackers, cereals, and pasta. Unsweetened oatmeal. Bulgur. Barley. Quinoa. Brown rice. Corn or whole-wheat flour tortillas or taco shells. Meats and other proteins Seafood. Poultry without skin. Lean cuts of pork and beef. Tofu. Eggs. Nuts. Beans. Dairy Low-fat or fat-free dairy products, such as yogurt, cottage cheese, and cheese. Beverages Water. Tea. Coffee. Sugar-free or diet soda. Seltzer water. Low-fat or nonfat milk. Milk alternatives, such as soy or almond milk. Fats and oils Olive oil. Canola oil. Sunflower oil. Grapeseed oil. Avocado. Walnuts. Sweets and  desserts Sugar-free or low-fat pudding. Sugar-free or low-fat ice cream and other frozen treats. Seasonings and condiments Herbs. Sodium-free spices. Mustard. Relish. Low-salt, low-sugar ketchup. Low-salt, low-sugar barbecue sauce.  Low-fat or fat-free mayonnaise. The items listed above may not be a complete list of recommended foods and beverages. Contact a dietitian for more information. What foods are not recommended? Fruits Fruits canned with syrup. Vegetables Canned vegetables. Frozen vegetables with butter or cream sauce. Grains Refined white flour and flour products, such as bread, pasta, snack foods, and cereals. Meats and other proteins Fatty cuts of meat. Poultry with skin. Breaded or fried meat. Processed meats. Dairy Full-fat yogurt, cheese, or milk. Beverages Sweetened drinks, such as iced tea and soda. Fats and oils Butter. Lard. Ghee. Sweets and desserts Baked goods, such as cake, cupcakes, pastries, cookies, and cheesecake. Seasonings and condiments Spice mixes with added salt. Ketchup. Barbecue sauce. Mayonnaise. The items listed above may not be a complete list of foods and beverages that are not recommended. Contact a dietitian for more information. Where to find more information  American Diabetes Association: www.diabetes.org Summary  You may need to make diet and lifestyle changes to help prevent the onset of diabetes. These changes can help you control blood sugar, improve cholesterol levels, and manage blood pressure.  Set weight loss goals with help from your health care team. It is recommended that most people with prediabetes lose 7% of their body weight.  Consider following a Mediterranean diet. This includes eating plenty of fresh fruits and vegetables, whole grains, beans, nuts, seeds, fish, and low-fat dairy, and using olive oil instead of other fats. This information is not intended to replace advice given to you by your health care provider. Make sure you discuss any questions you have with your health care provider. Document Revised: 03/06/2020 Document Reviewed: 03/06/2020 Elsevier Patient Education  2021 [employer].

## 2021-02-02 NOTE — Progress Notes (Signed)
    SUBJECTIVE:   CHIEF COMPLAINT / HPI:   The patient has an appointment with the gynecologist later today for IUD placement.  Patient concerned about her "iron levels" because of her heavy menstrual bleeding that is still ongoing.  She is taking iron supplementation daily.  Not having any symptoms of anemia.  Patient wanted to know what her A1c was for prediabetes.  We discussed low carbohydrate diets and exercise including walking if she cannot afford or otherwise have access to a gym.  PERTINENT  PMH / PSH: Abnormal uterine bleeding, currently living in homeless shelter  OBJECTIVE:   BP 112/82   Pulse (!) 109   Ht 5\' 4"  (1.626 m)   Wt 243 lb (110.2 kg)   SpO2 96%   BMI 41.71 kg/m   General: Alert, oriented.  No acute distress.  Obese. CV: Regular rate and rhythm, no murmurs Pulmonary: Lungs clear auscultation bilaterally   ASSESSMENT/PLAN:   Prediabetes 6.2 today, slight improvement from last time.  Discussed diet and exercise recommendations with patient.  Advised limiting sugar, starches.  Follow-up with PCP in 6 months.  Abnormal uterine bleeding (AUB) Patient has an appointment later today with the gynecologist to get an IUD placed.  Has been compliant with the medications they prescribed for her bleeding.  Unable to get the hysterectomy at this time due to living in a homeless shelter and not having anyone to watch her children while she recovers.  Taking iron supplementation.  Hemoglobin was 11.7 today.  Advised patient to continue taking iron supplementation.     , MD [facility]

## 2021-02-02 NOTE — Progress Notes (Signed)
    GYNECOLOGY CLINIC PROCEDURE NOTE  Tamara Mccarthy is a 40 y.o. [phone] here for Mirena IUD insertion. No GYN concerns.  Last pap smear was on 7/21 and was normal.  IUD Insertion Procedure Note Patient identified, informed consent performed, consent signed.   Discussed risks of irregular bleeding, cramping, infection, malpositioning or misplacement of the IUD outside the uterus which may require further procedure such as laparoscopy. Time out was performed.  Urine pregnancy test negative.  Speculum placed in the vagina.  Cervix visualized.  Cleaned with Betadine x 2.  Grasped anteriorly with a single tooth tenaculum.  Uterus sounded to 10 cm.  Liletta IUD placed per manufacturer's recommendations.  Strings trimmed to 3 cm. Tenaculum was removed, good hemostasis noted.  Patient tolerated procedure well.   Patient was given post-procedure instructions.  She was advised to have backup contraception for one week.  Patient was also asked to check IUD strings periodically and follow up in 4 weeks for IUD check.   Nettie Elm MD, FACOG Attending Obstetrician & Gynecologist Center for [facility], [facility]

## 2021-02-02 NOTE — Patient Instructions (Signed)

## 2021-02-05 ENCOUNTER — Encounter: Payer: Medicaid Other | Admitting: Physical Therapy

## 2021-02-05 DIAGNOSIS — N938 Other specified abnormal uterine and vaginal bleeding: Secondary | ICD-10-CM | POA: Diagnosis not present

## 2021-02-05 DIAGNOSIS — N939 Abnormal uterine and vaginal bleeding, unspecified: Secondary | ICD-10-CM | POA: Diagnosis not present

## 2021-02-05 DIAGNOSIS — T8332XA Displacement of intrauterine contraceptive device, initial encounter: Secondary | ICD-10-CM | POA: Diagnosis not present

## 2021-02-05 DIAGNOSIS — Z87891 Personal history of nicotine dependence: Secondary | ICD-10-CM | POA: Diagnosis not present

## 2021-02-05 DIAGNOSIS — Z7982 Long term (current) use of aspirin: Secondary | ICD-10-CM | POA: Diagnosis not present

## 2021-02-05 DIAGNOSIS — Z79899 Other long term (current) drug therapy: Secondary | ICD-10-CM | POA: Diagnosis not present

## 2021-02-05 DIAGNOSIS — R109 Unspecified abdominal pain: Secondary | ICD-10-CM | POA: Diagnosis not present

## 2021-02-06 ENCOUNTER — Other Ambulatory Visit: Payer: Self-pay | Admitting: Lactation Services

## 2021-02-06 ENCOUNTER — Encounter: Payer: Self-pay | Admitting: Family Medicine

## 2021-02-06 MED ORDER — MEGESTROL ACETATE 20 MG PO TABS: 20.0000 mg | ORAL_TABLET | Freq: Three times a day (TID) | ORAL | 1 refills | Status: DC

## 2021-02-11 ENCOUNTER — Other Ambulatory Visit: Payer: Self-pay | Admitting: Family Medicine

## 2021-02-11 DIAGNOSIS — N939 Abnormal uterine and vaginal bleeding, unspecified: Secondary | ICD-10-CM

## 2021-02-19 ENCOUNTER — Encounter: Payer: Medicaid Other | Admitting: Physical Therapy

## 2021-03-05 DIAGNOSIS — N771 Vaginitis, vulvitis and vulvovaginitis in diseases classified elsewhere: Secondary | ICD-10-CM | POA: Diagnosis not present

## 2021-03-05 DIAGNOSIS — R202 Paresthesia of skin: Secondary | ICD-10-CM | POA: Diagnosis not present

## 2021-03-11 ENCOUNTER — Ambulatory Visit: Payer: Medicaid Other | Admitting: Obstetrics and Gynecology

## 2021-03-16 ENCOUNTER — Ambulatory Visit: Payer: Medicaid Other | Admitting: Obstetrics and Gynecology

## 2021-03-16 ENCOUNTER — Encounter: Payer: Self-pay | Admitting: *Deleted

## 2021-03-16 NOTE — Progress Notes (Unsigned)
Pt. DNKA IUD string check. Per discussion with provider he will message patient . Linda,RN

## 2021-03-18 ENCOUNTER — Encounter: Payer: Self-pay | Admitting: General Practice

## 2021-04-29 DIAGNOSIS — Z113 Encounter for screening for infections with a predominantly sexual mode of transmission: Secondary | ICD-10-CM | POA: Diagnosis not present

## 2021-04-29 DIAGNOSIS — B009 Herpesviral infection, unspecified: Secondary | ICD-10-CM | POA: Diagnosis not present

## 2021-04-29 DIAGNOSIS — E668 Other obesity: Secondary | ICD-10-CM | POA: Diagnosis not present

## 2021-04-29 DIAGNOSIS — Z6841 Body Mass Index (BMI) 40.0 and over, adult: Secondary | ICD-10-CM | POA: Diagnosis not present

## 2021-04-29 DIAGNOSIS — N760 Acute vaginitis: Secondary | ICD-10-CM | POA: Diagnosis not present

## 2021-04-29 DIAGNOSIS — Z0000 Encounter for general adult medical examination without abnormal findings: Secondary | ICD-10-CM | POA: Diagnosis not present

## 2021-04-29 DIAGNOSIS — R7303 Prediabetes: Secondary | ICD-10-CM | POA: Diagnosis not present

## 2021-04-29 DIAGNOSIS — F419 Anxiety disorder, unspecified: Secondary | ICD-10-CM | POA: Diagnosis not present

## 2021-06-21 ENCOUNTER — Other Ambulatory Visit: Payer: Self-pay | Admitting: Obstetrics and Gynecology

## 2021-06-21 DIAGNOSIS — N939 Abnormal uterine and vaginal bleeding, unspecified: Secondary | ICD-10-CM

## 2021-06-22 IMAGING — US US PELVIS COMPLETE WITH TRANSVAGINAL
1 series · 13 of 25 positions shown · non-contrast
Comparison: None

CLINICAL DATA: Abnormal uterine bleeding; pelvic pain; LMP
08/03/2020

EXAM:
TRANSABDOMINAL AND TRANSVAGINAL ULTRASOUND OF PELVIS
TECHNIQUE: Both transabdominal and transvaginal ultrasound examinations of the
pelvis were performed. Transabdominal technique was performed for
global imaging of the pelvis including uterus, ovaries, adnexal
regions, and pelvic cul-de-sac. It was necessary to proceed with
endovaginal exam following the transabdominal exam to visualize the
lower uterine segment and ovaries.

[Series 1: us pelvis complete with transvaginal · 0.25mm/px · 13 of 79 slices shown]
[im 1/79]
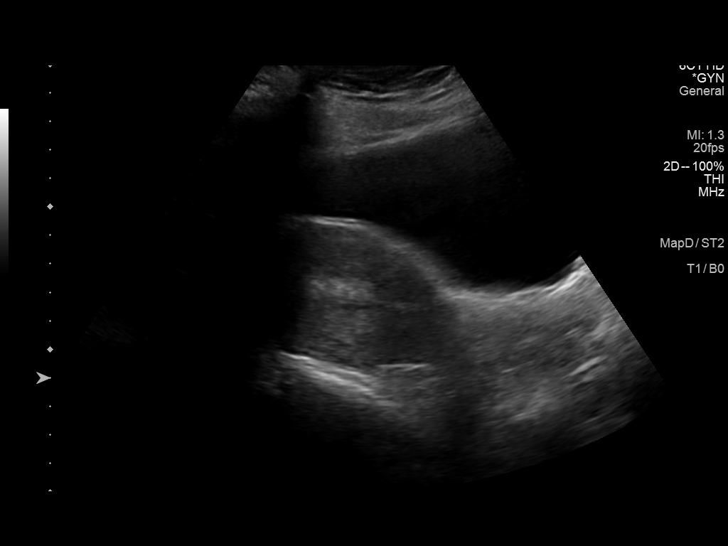
[im 7/79]
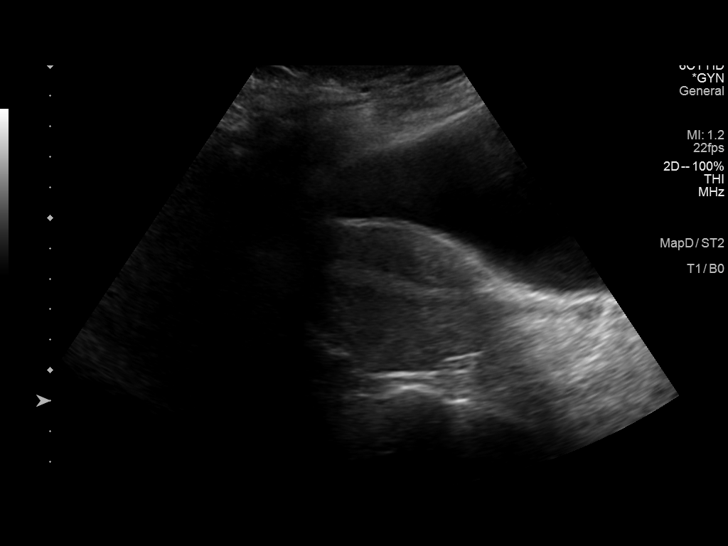
[im 14/79]
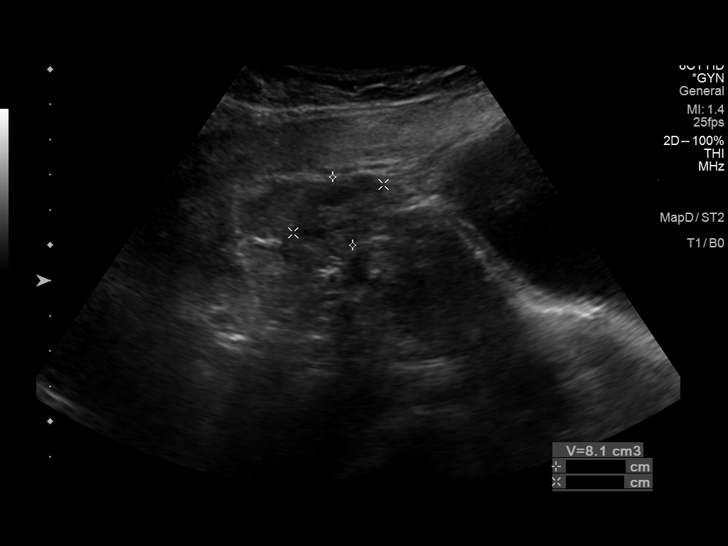
[im 20/79]
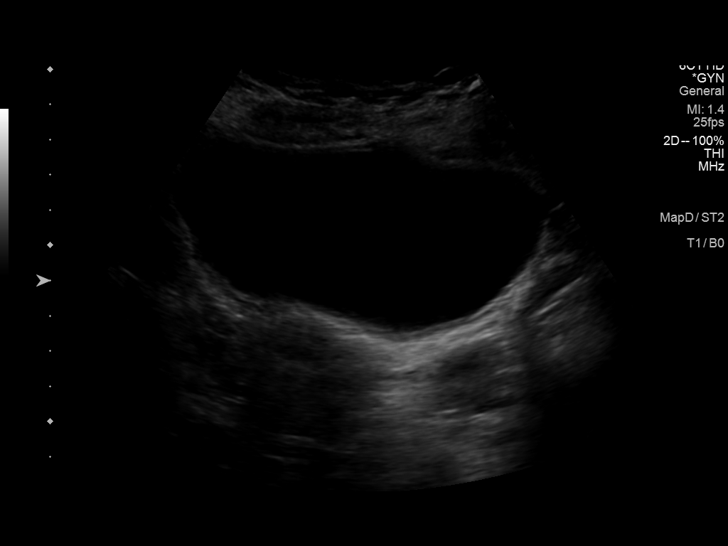
[im 27/79]
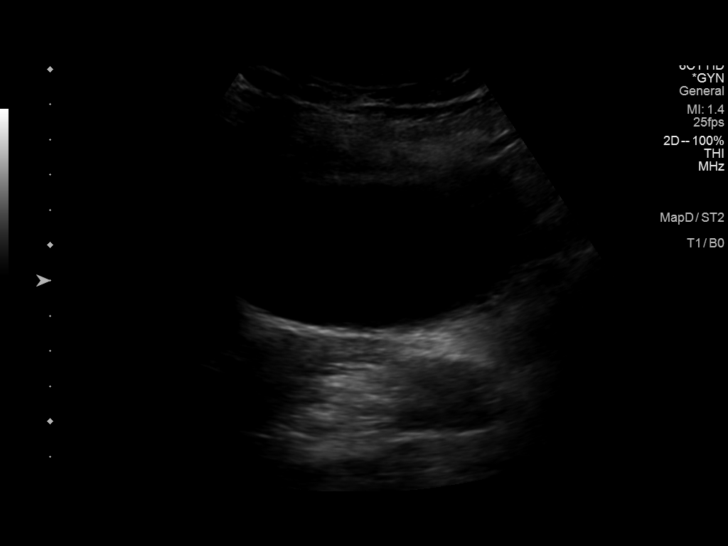
[im 33/79]
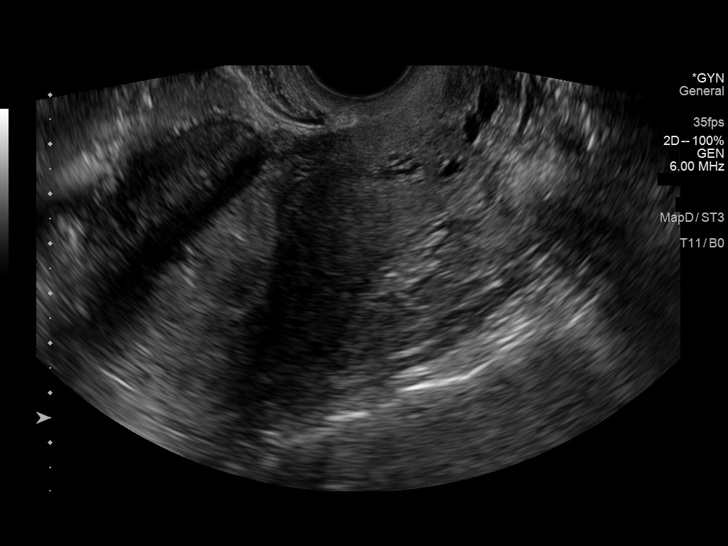
[im 40/79]
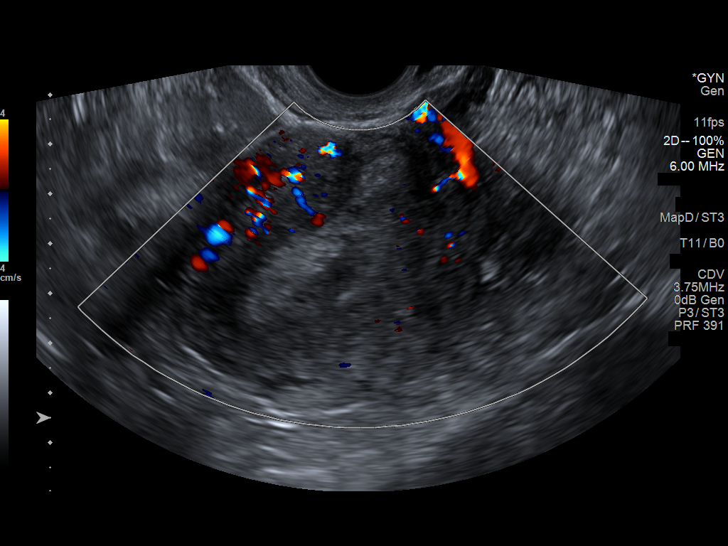
[im 46/79]
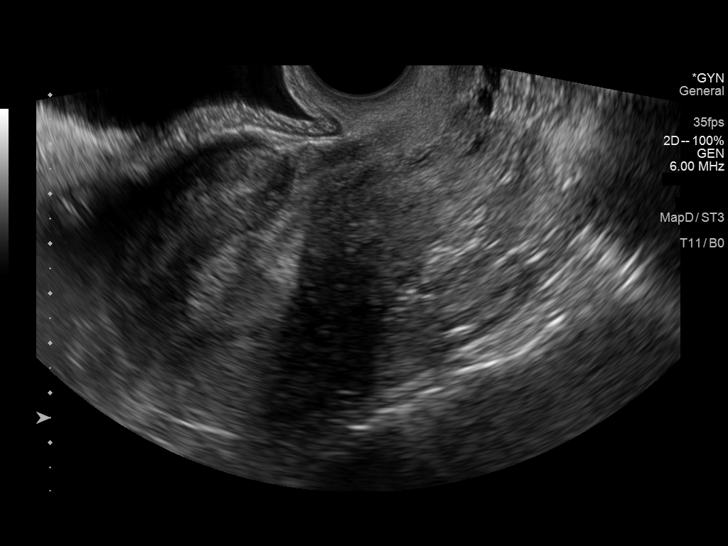
[im 53/79]
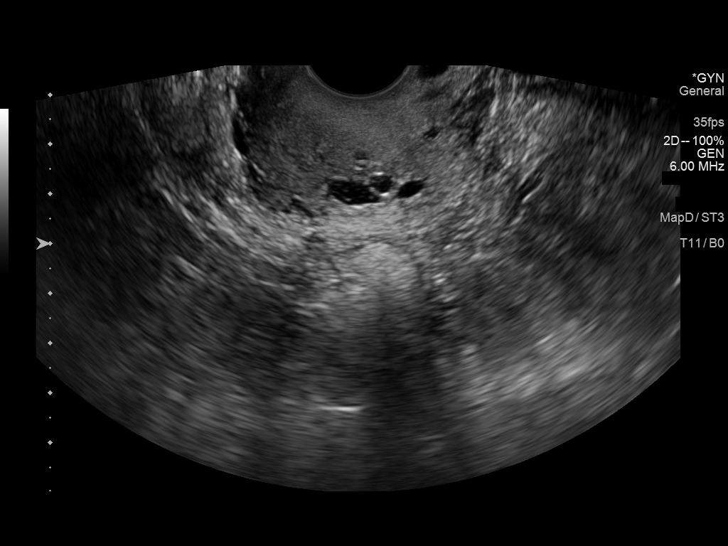
[im 59/79]
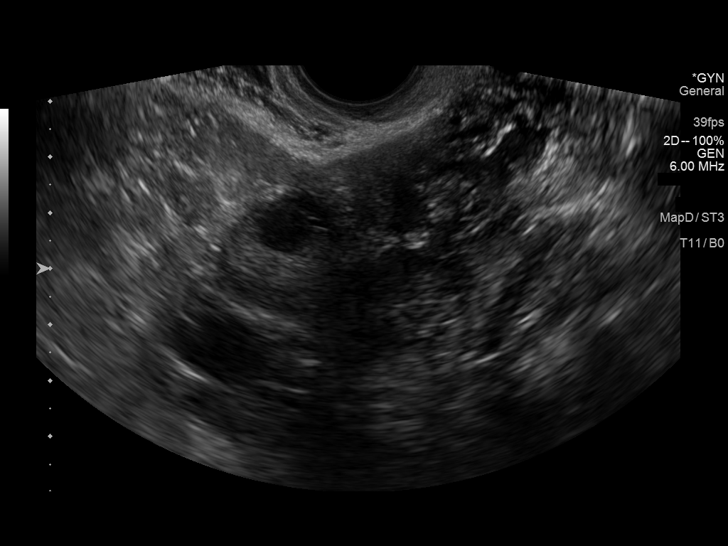
[im 66/79]
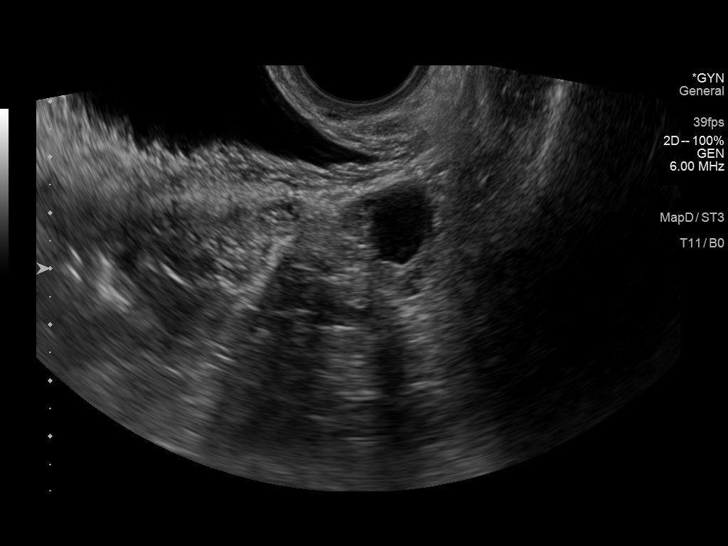
[im 72/79]
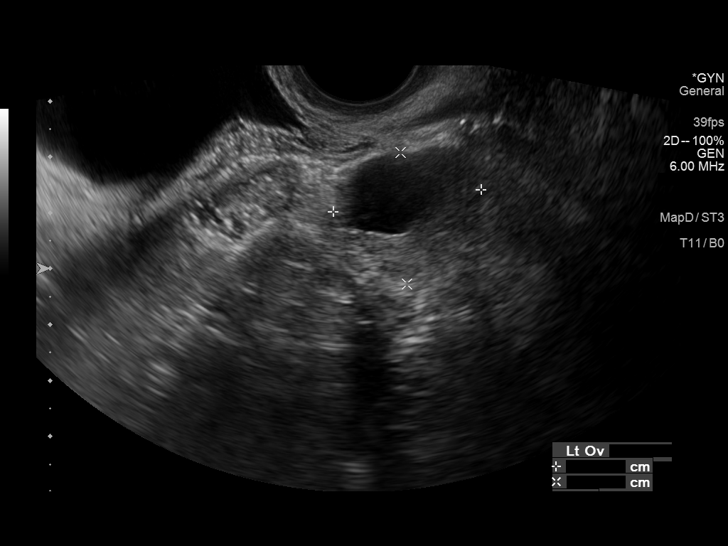
[im 79/79]
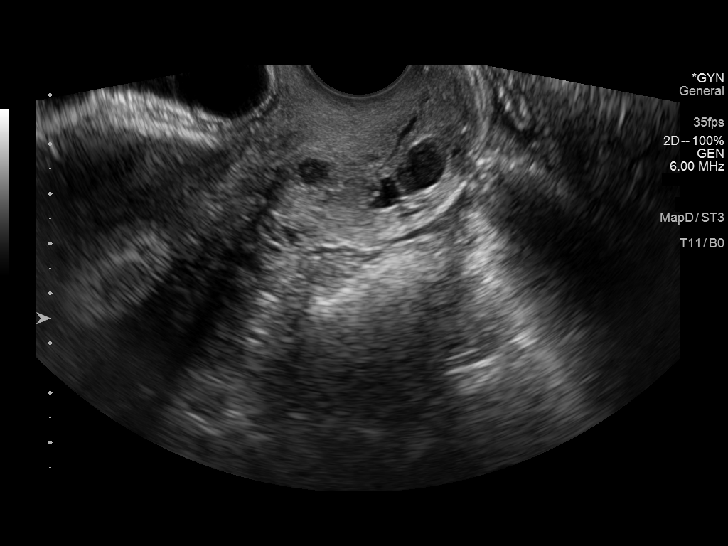

[13 of 25 positions shown; findings below may reference images not displayed]

FINDINGS: Uterus

Measurements: 12.0 x 4.8 x 6.6 cm = volume: 199 mL. Anteverted.
Heterogeneous myometrium. Multiple nabothian cysts in cervix. No
definite mass.

Endometrium

Thickness: 17 mm. Heterogeneous echogenicity without discrete fluid
or mass

Right ovary

Measurements: 2.8 x 2.0 x 2.8 cm = volume: 8.5 mL. Normal morphology
without mass

Left ovary

Measurements: 2.7 x 2.4 x 2.5 cm = volume: 8.3 mL. Normal morphology
without mass

Other findings

No free pelvic fluid.  No adnexal masses.
IMPRESSION: Heterogeneous endometrial complex 17 mm thick; if bleeding remains
unresponsive to hormonal or medical therapy, focal lesion work-up
with sonohysterogram should be considered. Endometrial biopsy should
also be considered in pre-menopausal patients at high risk for
endometrial carcinoma. (Ref: Radiological Reasoning: Algorithmic
Workup of Abnormal Vaginal Bleeding with Endovaginal Sonography and
Sonohysterography. [id number]; 191:[id number])

Otherwise normal appearing uterus and ovaries.

## 2021-07-03 DIAGNOSIS — Z6839 Body mass index (BMI) 39.0-39.9, adult: Secondary | ICD-10-CM | POA: Diagnosis not present

## 2021-07-03 DIAGNOSIS — N898 Other specified noninflammatory disorders of vagina: Secondary | ICD-10-CM | POA: Diagnosis not present

## 2021-07-03 DIAGNOSIS — R300 Dysuria: Secondary | ICD-10-CM | POA: Diagnosis not present

## 2021-07-03 DIAGNOSIS — N3000 Acute cystitis without hematuria: Secondary | ICD-10-CM | POA: Diagnosis not present

## 2021-07-03 DIAGNOSIS — B009 Herpesviral infection, unspecified: Secondary | ICD-10-CM | POA: Diagnosis not present

## 2021-07-17 DIAGNOSIS — Z6838 Body mass index (BMI) 38.0-38.9, adult: Secondary | ICD-10-CM | POA: Diagnosis not present

## 2021-07-17 DIAGNOSIS — A599 Trichomoniasis, unspecified: Secondary | ICD-10-CM | POA: Diagnosis not present

## 2021-07-17 DIAGNOSIS — N390 Urinary tract infection, site not specified: Secondary | ICD-10-CM | POA: Diagnosis not present

## 2021-07-19 ENCOUNTER — Other Ambulatory Visit: Payer: Self-pay | Admitting: Family Medicine

## 2021-07-20 DIAGNOSIS — R7303 Prediabetes: Secondary | ICD-10-CM | POA: Diagnosis not present

## 2021-07-20 DIAGNOSIS — Z7185 Encounter for immunization safety counseling: Secondary | ICD-10-CM | POA: Diagnosis not present

## 2021-07-20 DIAGNOSIS — E7800 Pure hypercholesterolemia, unspecified: Secondary | ICD-10-CM | POA: Diagnosis not present

## 2021-07-20 DIAGNOSIS — Z23 Encounter for immunization: Secondary | ICD-10-CM | POA: Diagnosis not present

## 2021-07-20 DIAGNOSIS — J302 Other seasonal allergic rhinitis: Secondary | ICD-10-CM | POA: Diagnosis not present

## 2021-07-20 DIAGNOSIS — Z7182 Exercise counseling: Secondary | ICD-10-CM | POA: Diagnosis not present

## 2021-07-20 DIAGNOSIS — F419 Anxiety disorder, unspecified: Secondary | ICD-10-CM | POA: Diagnosis not present

## 2021-07-20 DIAGNOSIS — Z760 Encounter for issue of repeat prescription: Secondary | ICD-10-CM | POA: Diagnosis not present

## 2021-07-20 DIAGNOSIS — Z8249 Family history of ischemic heart disease and other diseases of the circulatory system: Secondary | ICD-10-CM | POA: Diagnosis not present

## 2021-07-20 DIAGNOSIS — Z87891 Personal history of nicotine dependence: Secondary | ICD-10-CM | POA: Diagnosis not present

## 2021-07-20 DIAGNOSIS — Z713 Dietary counseling and surveillance: Secondary | ICD-10-CM | POA: Diagnosis not present

## 2021-08-26 DIAGNOSIS — Z7189 Other specified counseling: Secondary | ICD-10-CM | POA: Diagnosis not present

## 2021-08-26 DIAGNOSIS — N939 Abnormal uterine and vaginal bleeding, unspecified: Secondary | ICD-10-CM | POA: Diagnosis not present

## 2021-08-26 DIAGNOSIS — F419 Anxiety disorder, unspecified: Secondary | ICD-10-CM | POA: Diagnosis not present

## 2021-08-26 DIAGNOSIS — R300 Dysuria: Secondary | ICD-10-CM | POA: Diagnosis not present

## 2021-08-26 DIAGNOSIS — F5105 Insomnia due to other mental disorder: Secondary | ICD-10-CM | POA: Diagnosis not present

## 2021-08-26 DIAGNOSIS — N390 Urinary tract infection, site not specified: Secondary | ICD-10-CM | POA: Diagnosis not present

## 2021-08-26 DIAGNOSIS — F99 Mental disorder, not otherwise specified: Secondary | ICD-10-CM | POA: Diagnosis not present

## 2021-08-26 DIAGNOSIS — Z6838 Body mass index (BMI) 38.0-38.9, adult: Secondary | ICD-10-CM | POA: Diagnosis not present

## 2021-08-27 ENCOUNTER — Other Ambulatory Visit: Payer: Self-pay | Admitting: Family Medicine

## 2021-08-28 DIAGNOSIS — A599 Trichomoniasis, unspecified: Secondary | ICD-10-CM | POA: Diagnosis not present

## 2021-09-02 DIAGNOSIS — Z2821 Immunization not carried out because of patient refusal: Secondary | ICD-10-CM | POA: Diagnosis not present

## 2021-09-02 DIAGNOSIS — Z23 Encounter for immunization: Secondary | ICD-10-CM | POA: Diagnosis not present

## 2021-09-02 DIAGNOSIS — Z6837 Body mass index (BMI) 37.0-37.9, adult: Secondary | ICD-10-CM | POA: Diagnosis not present

## 2021-09-02 DIAGNOSIS — R7303 Prediabetes: Secondary | ICD-10-CM | POA: Diagnosis not present

## 2021-09-02 DIAGNOSIS — Z7185 Encounter for immunization safety counseling: Secondary | ICD-10-CM | POA: Diagnosis not present

## 2021-09-15 DIAGNOSIS — Z6838 Body mass index (BMI) 38.0-38.9, adult: Secondary | ICD-10-CM | POA: Diagnosis not present

## 2021-09-15 DIAGNOSIS — Z124 Encounter for screening for malignant neoplasm of cervix: Secondary | ICD-10-CM | POA: Diagnosis not present

## 2021-09-15 DIAGNOSIS — N939 Abnormal uterine and vaginal bleeding, unspecified: Secondary | ICD-10-CM | POA: Diagnosis not present

## 2021-09-15 DIAGNOSIS — N970 Female infertility associated with anovulation: Secondary | ICD-10-CM | POA: Diagnosis not present

## 2021-09-22 DIAGNOSIS — N939 Abnormal uterine and vaginal bleeding, unspecified: Secondary | ICD-10-CM | POA: Diagnosis not present

## 2021-09-22 DIAGNOSIS — Z6838 Body mass index (BMI) 38.0-38.9, adult: Secondary | ICD-10-CM | POA: Diagnosis not present

## 2021-09-22 DIAGNOSIS — N970 Female infertility associated with anovulation: Secondary | ICD-10-CM | POA: Diagnosis not present

## 2021-10-07 DIAGNOSIS — Z6838 Body mass index (BMI) 38.0-38.9, adult: Secondary | ICD-10-CM | POA: Diagnosis not present

## 2021-10-07 DIAGNOSIS — Z7185 Encounter for immunization safety counseling: Secondary | ICD-10-CM | POA: Diagnosis not present

## 2021-10-07 DIAGNOSIS — Z23 Encounter for immunization: Secondary | ICD-10-CM | POA: Diagnosis not present

## 2021-10-07 DIAGNOSIS — M79604 Pain in right leg: Secondary | ICD-10-CM | POA: Diagnosis not present

## 2021-10-07 DIAGNOSIS — F418 Other specified anxiety disorders: Secondary | ICD-10-CM | POA: Diagnosis not present

## 2021-10-07 DIAGNOSIS — M25519 Pain in unspecified shoulder: Secondary | ICD-10-CM | POA: Diagnosis not present

## 2021-10-07 DIAGNOSIS — M79605 Pain in left leg: Secondary | ICD-10-CM | POA: Diagnosis not present

## 2021-10-07 DIAGNOSIS — M542 Cervicalgia: Secondary | ICD-10-CM | POA: Diagnosis not present

## 2021-10-07 DIAGNOSIS — Z2821 Immunization not carried out because of patient refusal: Secondary | ICD-10-CM | POA: Diagnosis not present

## 2021-10-07 DIAGNOSIS — R300 Dysuria: Secondary | ICD-10-CM | POA: Diagnosis not present

## 2021-10-07 DIAGNOSIS — Z659 Problem related to unspecified psychosocial circumstances: Secondary | ICD-10-CM | POA: Diagnosis not present

## 2021-10-22 DIAGNOSIS — M5412 Radiculopathy, cervical region: Secondary | ICD-10-CM | POA: Diagnosis not present

## 2021-10-22 DIAGNOSIS — R6889 Other general symptoms and signs: Secondary | ICD-10-CM | POA: Diagnosis not present

## 2021-10-22 DIAGNOSIS — E7800 Pure hypercholesterolemia, unspecified: Secondary | ICD-10-CM | POA: Diagnosis not present

## 2021-10-28 ENCOUNTER — Other Ambulatory Visit: Payer: Self-pay | Admitting: Family Medicine

## 2021-10-31 DIAGNOSIS — R0602 Shortness of breath: Secondary | ICD-10-CM | POA: Diagnosis not present

## 2021-10-31 DIAGNOSIS — J45901 Unspecified asthma with (acute) exacerbation: Secondary | ICD-10-CM | POA: Diagnosis not present

## 2021-11-05 DIAGNOSIS — F5105 Insomnia due to other mental disorder: Secondary | ICD-10-CM | POA: Diagnosis not present

## 2021-11-05 DIAGNOSIS — F99 Mental disorder, not otherwise specified: Secondary | ICD-10-CM | POA: Diagnosis not present

## 2021-11-05 DIAGNOSIS — Z6838 Body mass index (BMI) 38.0-38.9, adult: Secondary | ICD-10-CM | POA: Diagnosis not present

## 2021-11-05 DIAGNOSIS — F419 Anxiety disorder, unspecified: Secondary | ICD-10-CM | POA: Diagnosis not present

## 2021-11-24 DIAGNOSIS — Z6838 Body mass index (BMI) 38.0-38.9, adult: Secondary | ICD-10-CM | POA: Diagnosis not present

## 2021-11-24 DIAGNOSIS — J40 Bronchitis, not specified as acute or chronic: Secondary | ICD-10-CM | POA: Diagnosis not present

## 2021-11-24 DIAGNOSIS — R300 Dysuria: Secondary | ICD-10-CM | POA: Diagnosis not present

## 2021-11-26 DIAGNOSIS — Z6838 Body mass index (BMI) 38.0-38.9, adult: Secondary | ICD-10-CM | POA: Diagnosis not present

## 2021-11-26 DIAGNOSIS — M62838 Other muscle spasm: Secondary | ICD-10-CM | POA: Diagnosis not present

## 2021-11-26 DIAGNOSIS — Z79899 Other long term (current) drug therapy: Secondary | ICD-10-CM | POA: Diagnosis not present

## 2021-11-26 DIAGNOSIS — Z7982 Long term (current) use of aspirin: Secondary | ICD-10-CM | POA: Diagnosis not present

## 2021-11-26 DIAGNOSIS — D649 Anemia, unspecified: Secondary | ICD-10-CM | POA: Diagnosis not present

## 2021-11-26 DIAGNOSIS — Z7951 Long term (current) use of inhaled steroids: Secondary | ICD-10-CM | POA: Diagnosis not present

## 2021-11-26 DIAGNOSIS — J45909 Unspecified asthma, uncomplicated: Secondary | ICD-10-CM | POA: Diagnosis not present

## 2021-11-26 DIAGNOSIS — E785 Hyperlipidemia, unspecified: Secondary | ICD-10-CM | POA: Diagnosis not present

## 2021-11-26 DIAGNOSIS — Z87891 Personal history of nicotine dependence: Secondary | ICD-10-CM | POA: Diagnosis not present

## 2021-11-26 DIAGNOSIS — Z8711 Personal history of peptic ulcer disease: Secondary | ICD-10-CM | POA: Diagnosis not present

## 2021-11-26 DIAGNOSIS — R252 Cramp and spasm: Secondary | ICD-10-CM | POA: Diagnosis not present

## 2022-01-06 DIAGNOSIS — Z6838 Body mass index (BMI) 38.0-38.9, adult: Secondary | ICD-10-CM | POA: Diagnosis not present

## 2022-01-06 DIAGNOSIS — N939 Abnormal uterine and vaginal bleeding, unspecified: Secondary | ICD-10-CM | POA: Diagnosis not present

## 2022-01-22 DIAGNOSIS — J069 Acute upper respiratory infection, unspecified: Secondary | ICD-10-CM | POA: Diagnosis not present

## 2022-01-22 DIAGNOSIS — Z87891 Personal history of nicotine dependence: Secondary | ICD-10-CM | POA: Diagnosis not present

## 2022-01-22 DIAGNOSIS — B349 Viral infection, unspecified: Secondary | ICD-10-CM | POA: Diagnosis not present

## 2022-01-22 DIAGNOSIS — E785 Hyperlipidemia, unspecified: Secondary | ICD-10-CM | POA: Diagnosis not present

## 2022-01-22 DIAGNOSIS — J45909 Unspecified asthma, uncomplicated: Secondary | ICD-10-CM | POA: Diagnosis not present

## 2022-01-22 DIAGNOSIS — Z79899 Other long term (current) drug therapy: Secondary | ICD-10-CM | POA: Diagnosis not present

## 2022-01-22 DIAGNOSIS — Z20822 Contact with and (suspected) exposure to covid-19: Secondary | ICD-10-CM | POA: Diagnosis not present

## 2022-01-25 DIAGNOSIS — J0110 Acute frontal sinusitis, unspecified: Secondary | ICD-10-CM | POA: Diagnosis not present

## 2022-01-25 DIAGNOSIS — Z6838 Body mass index (BMI) 38.0-38.9, adult: Secondary | ICD-10-CM | POA: Diagnosis not present

## 2022-02-06 DIAGNOSIS — R42 Dizziness and giddiness: Secondary | ICD-10-CM | POA: Diagnosis not present

## 2022-02-06 DIAGNOSIS — J3489 Other specified disorders of nose and nasal sinuses: Secondary | ICD-10-CM | POA: Diagnosis not present

## 2022-02-06 DIAGNOSIS — R0981 Nasal congestion: Secondary | ICD-10-CM | POA: Diagnosis not present

## 2022-02-06 DIAGNOSIS — R059 Cough, unspecified: Secondary | ICD-10-CM | POA: Diagnosis not present

## 2022-02-06 DIAGNOSIS — R509 Fever, unspecified: Secondary | ICD-10-CM | POA: Diagnosis not present

## 2022-02-06 DIAGNOSIS — R531 Weakness: Secondary | ICD-10-CM | POA: Diagnosis not present

## 2022-02-06 DIAGNOSIS — R112 Nausea with vomiting, unspecified: Secondary | ICD-10-CM | POA: Diagnosis not present

## 2022-02-06 DIAGNOSIS — U071 COVID-19: Secondary | ICD-10-CM | POA: Diagnosis not present

## 2022-02-06 DIAGNOSIS — J029 Acute pharyngitis, unspecified: Secondary | ICD-10-CM | POA: Diagnosis not present

## 2022-02-17 DIAGNOSIS — Z760 Encounter for issue of repeat prescription: Secondary | ICD-10-CM | POA: Diagnosis not present

## 2022-02-17 DIAGNOSIS — Z6837 Body mass index (BMI) 37.0-37.9, adult: Secondary | ICD-10-CM | POA: Diagnosis not present

## 2022-02-17 DIAGNOSIS — R0602 Shortness of breath: Secondary | ICD-10-CM | POA: Diagnosis not present

## 2022-02-17 DIAGNOSIS — U071 COVID-19: Secondary | ICD-10-CM | POA: Diagnosis not present

## 2022-02-17 DIAGNOSIS — R0689 Other abnormalities of breathing: Secondary | ICD-10-CM | POA: Diagnosis not present

## 2022-02-26 DIAGNOSIS — J45909 Unspecified asthma, uncomplicated: Secondary | ICD-10-CM | POA: Diagnosis not present

## 2022-02-26 DIAGNOSIS — E668 Other obesity: Secondary | ICD-10-CM | POA: Diagnosis not present

## 2022-02-26 DIAGNOSIS — J309 Allergic rhinitis, unspecified: Secondary | ICD-10-CM | POA: Diagnosis not present

## 2022-02-26 DIAGNOSIS — J4550 Severe persistent asthma, uncomplicated: Secondary | ICD-10-CM | POA: Diagnosis not present

## 2022-02-26 DIAGNOSIS — Z8616 Personal history of COVID-19: Secondary | ICD-10-CM | POA: Diagnosis not present

## 2022-03-02 DIAGNOSIS — R079 Chest pain, unspecified: Secondary | ICD-10-CM | POA: Diagnosis not present

## 2022-03-02 DIAGNOSIS — R778 Other specified abnormalities of plasma proteins: Secondary | ICD-10-CM | POA: Diagnosis not present

## 2022-03-02 DIAGNOSIS — Z5321 Procedure and treatment not carried out due to patient leaving prior to being seen by health care provider: Secondary | ICD-10-CM | POA: Diagnosis not present

## 2022-03-17 DIAGNOSIS — E7800 Pure hypercholesterolemia, unspecified: Secondary | ICD-10-CM | POA: Diagnosis not present

## 2022-03-17 DIAGNOSIS — Z2821 Immunization not carried out because of patient refusal: Secondary | ICD-10-CM | POA: Diagnosis not present

## 2022-03-17 DIAGNOSIS — Z7182 Exercise counseling: Secondary | ICD-10-CM | POA: Diagnosis not present

## 2022-03-17 DIAGNOSIS — Z713 Dietary counseling and surveillance: Secondary | ICD-10-CM | POA: Diagnosis not present

## 2022-03-17 DIAGNOSIS — R42 Dizziness and giddiness: Secondary | ICD-10-CM | POA: Diagnosis not present

## 2022-03-17 DIAGNOSIS — R7303 Prediabetes: Secondary | ICD-10-CM | POA: Diagnosis not present

## 2022-03-17 DIAGNOSIS — J302 Other seasonal allergic rhinitis: Secondary | ICD-10-CM | POA: Diagnosis not present

## 2022-03-17 DIAGNOSIS — Z7185 Encounter for immunization safety counseling: Secondary | ICD-10-CM | POA: Diagnosis not present

## 2022-03-17 DIAGNOSIS — Z8616 Personal history of COVID-19: Secondary | ICD-10-CM | POA: Diagnosis not present

## 2022-03-17 DIAGNOSIS — Z8249 Family history of ischemic heart disease and other diseases of the circulatory system: Secondary | ICD-10-CM | POA: Diagnosis not present

## 2022-03-17 DIAGNOSIS — Z87891 Personal history of nicotine dependence: Secondary | ICD-10-CM | POA: Diagnosis not present

## 2022-04-20 DIAGNOSIS — Z87891 Personal history of nicotine dependence: Secondary | ICD-10-CM | POA: Diagnosis not present

## 2022-04-20 DIAGNOSIS — Z6837 Body mass index (BMI) 37.0-37.9, adult: Secondary | ICD-10-CM | POA: Diagnosis not present

## 2022-04-20 DIAGNOSIS — Z6838 Body mass index (BMI) 38.0-38.9, adult: Secondary | ICD-10-CM | POA: Diagnosis not present

## 2022-04-20 DIAGNOSIS — R7303 Prediabetes: Secondary | ICD-10-CM | POA: Diagnosis not present

## 2022-04-20 DIAGNOSIS — E7800 Pure hypercholesterolemia, unspecified: Secondary | ICD-10-CM | POA: Diagnosis not present

## 2022-04-20 DIAGNOSIS — Z23 Encounter for immunization: Secondary | ICD-10-CM | POA: Diagnosis not present

## 2022-04-28 DIAGNOSIS — E668 Other obesity: Secondary | ICD-10-CM | POA: Diagnosis not present

## 2022-04-28 DIAGNOSIS — J45998 Other asthma: Secondary | ICD-10-CM | POA: Diagnosis not present

## 2022-04-28 DIAGNOSIS — Z8616 Personal history of COVID-19: Secondary | ICD-10-CM | POA: Diagnosis not present

## 2022-04-28 DIAGNOSIS — J309 Allergic rhinitis, unspecified: Secondary | ICD-10-CM | POA: Diagnosis not present

## 2022-04-28 DIAGNOSIS — J4550 Severe persistent asthma, uncomplicated: Secondary | ICD-10-CM | POA: Diagnosis not present

## 2022-05-10 DIAGNOSIS — Z7189 Other specified counseling: Secondary | ICD-10-CM | POA: Diagnosis not present

## 2022-05-10 DIAGNOSIS — F99 Mental disorder, not otherwise specified: Secondary | ICD-10-CM | POA: Diagnosis not present

## 2022-05-10 DIAGNOSIS — F419 Anxiety disorder, unspecified: Secondary | ICD-10-CM | POA: Diagnosis not present

## 2022-05-10 DIAGNOSIS — Z6837 Body mass index (BMI) 37.0-37.9, adult: Secondary | ICD-10-CM | POA: Diagnosis not present

## 2022-05-10 DIAGNOSIS — F5105 Insomnia due to other mental disorder: Secondary | ICD-10-CM | POA: Diagnosis not present

## 2022-05-25 ENCOUNTER — Encounter: Payer: Self-pay | Admitting: *Deleted

## 2022-07-27 NOTE — Congregational Nurse Program (Signed)
Met with client along with her 3 boys in the common area at the [facility]. They were all having snacks (watermelon and pop corn) Mom reports that she has found and scheduled appointments for dentist and eye doctor for boys. She had not previously indicated there was a need for either. States that it's for their regular dental check-ups.  The Optometrist is to get new prescriptions for eyeglasses for Tamara Mccarthy and Tamara Mccarthy. She states that she has enrolled all three into school.  Need to go back to [place] to obtain shot records.  All of them are up to date on immunizations. Tamara Mccarthy will have f/u for pre-diabetes when she returns as well. Nurse wanted to check Tamara Mccarthy's blood sugar this afternoon however he just finished eating lots of watermelon.  Plan:  Mom to keep upcoming appointments. Nurse will check to see if they are all enrolled with [facility] as mom states they were seen there a few years back. She would like to go back there if possible.  Tamara Moncrief D. Tamara Caraway MSN, Advice worker Nurse [employer] [phone]

## 2022-07-27 NOTE — Congregational Nurse Program (Signed)
Met with client this afternoon in the sunroom at [facility]. Her three sons are on the other side of building.  Client very pleasant.  She has been here at the shelter for one week now. States they have been without housing for 2 years now.  Reports a medical history of Anemia, Asthma, High Cholesterol, Anxiety, need for Birth Control. Client neither children have a PCP here, but will need one as she is on several medications. Client reports taking Symbicort 160/4.976m and nebulizer with albuterol when needed for asthma. Takes Crestor 256mQD, Lexapro 76m23mD Client currently report having all medications.  Plan:  Nurse will provide client with resource information with doctors who accept MCD.  Informed client to call number on back of MCD card to inform of new location and need to transfer MCD. Nurse provided client with information on Diabetes to read along with ZacAlroy Dusto she states has pre-diabetes.

## 2022-08-17 ENCOUNTER — Encounter: Payer: Medicaid Other | Admitting: Student

## 2022-08-17 NOTE — Congregational Nurse Program (Signed)
Met with client along with her 3 boys in the sunroom at the shelter this afternoon.  Everyone was in good spirits. Client reports that she has been looking for housing, but first has to take care of a previous light bill that went into collections.  She has been paying on it and plans to be paid in full by the end of this month.  Client has asked for an extension here at the shelter and is awaiting  response. Client unsure if she needs to cancel primary care appt. Made by the nurse as she has not had her or the children's MCD transferred here yet. The reason being is because they have upcoming specialty medical appts. In [place] that she states she needs to keep.  Several are for herself and one is for Tamara Mccarthy for follow-up of pre-diabetes.. Nurse stated understanding of this circumstance and asked client to give a call to [facility] and share this with them before canceling appt. Client's medical issues are under control and she reports taking medications as ordered. Nurses will follow up with client on next week.  Tamara Mccarthy D. Tamara Caraway MSN, [place] [employer] [phone]

## 2022-08-27 DIAGNOSIS — Z20822 Contact with and (suspected) exposure to covid-19: Secondary | ICD-10-CM | POA: Diagnosis not present

## 2022-08-27 DIAGNOSIS — R059 Cough, unspecified: Secondary | ICD-10-CM | POA: Diagnosis not present

## 2022-08-27 DIAGNOSIS — J398 Other specified diseases of upper respiratory tract: Secondary | ICD-10-CM | POA: Diagnosis not present

## 2022-08-27 DIAGNOSIS — J45901 Unspecified asthma with (acute) exacerbation: Secondary | ICD-10-CM | POA: Diagnosis not present

## 2022-09-01 DIAGNOSIS — R0602 Shortness of breath: Secondary | ICD-10-CM | POA: Diagnosis not present

## 2022-09-01 DIAGNOSIS — R062 Wheezing: Secondary | ICD-10-CM | POA: Diagnosis not present

## 2022-09-21 NOTE — Patient Instructions (Signed)
It was great seeing you today.  We will plan to see you again *   If you have any questions or concerns, please feel free to call the clinic.    Be well,  Dr. Orvis Brill [facility] [phone]

## 2022-09-21 NOTE — Progress Notes (Signed)
    SUBJECTIVE:   Chief compliant/HPI: annual examination  Tamara Mccarthy is a 41 y.o. who presents today for an annual exam. Just moved back here to [place] in July.  Burning with urination for past 2 weeks. No fevers, chills, back pain Feels like UTI. No change in discharge. No concern for STI.  Currently taking a OCP. Was to have a hysterectomy for AUB, but did not have it done. Had an endometrial biopsy that was normal.  Has a history of anemia. Taking iron pills.  Takes Lexapro 10 mg daily for anxiety or depression. No SI or HI. Feeling stable.  Just got her house, was at a homeless shelter. Has 3 kids.  History tabs reviewed and updated.   Review of systems form reviewed and notable for .   OBJECTIVE:   BP 123/72   Pulse 78   Ht $R'5\' 4"'qs$  (1.626 m)   Wt 252 lb 6.4 oz (114.5 kg)   SpO2 100%   BMI 43.32 kg/m   General: Alert and cooperative and appears to be in no acute distress Cardio: Normal S1 and S2, no S3 or S4. Rhythm is regular. No murmurs or rubs.   Pulm: Clear to auscultation bilaterally, no crackles, wheezing, or diminished breath sounds. Normal respiratory effort Abdomen: Bowel sounds normal. Abdomen soft and non-tender.  Extremities: No peripheral edema. Warm/ well perfused.  Strong radial pulses. Neuro: Cranial nerves grossly intact   ASSESSMENT/PLAN:   No problem-specific Assessment & Plan notes found for this encounter.    Annual Examination  See AVS for age appropriate recommendations.   PHQ score 2, reviewed and discussed.  Blood pressure reviewed and at goal.  Asked about intimate partner violence and resources given as appropriate  The patient currently uses OCP for contraception. Folate recommended as appropriate, minimum of 400 mcg per day.   Considered the following items based upon USPSTF recommendations: Diabetes screening: ordered Screening for elevated cholesterol: ordered HIV testing: ordered Hepatitis C: ordered Hepatitis  B: discussed Syphilis if at high risk: ordered GC/CT not at high risk and not ordered. Reviewed risk factors for latent tuberculosis and not indicated Reviewed risk factors for osteoporosis. Using FRAX tool estimated risk of major osteoporotic fracture of  ***%, early screening {ordered not order:23822::"not ordered"}   Discussed family history, BRCA testing not indicated. Tool used to risk stratify was.  Cervical cancer screening: prior Pap reviewed, repeat due in 2022 Breast cancer screening: discussed potential benefits, risks including overdiagnosis and biopsy, elected proceed with mammogram Colorectal cancer screening: not applicable given age.  if age 75 or over.   SMART goal for weight loss: Drinks 3 large sweet teas a week, will cut down to small   Follow up in 1  year or sooner if indicated.    Orvis Brill, [place]

## 2022-09-22 ENCOUNTER — Encounter: Payer: Self-pay | Admitting: Student

## 2022-09-22 ENCOUNTER — Ambulatory Visit: Payer: Medicaid Other | Admitting: Student

## 2022-09-22 VITALS — BP 123/72 | HR 78 | Wt 224.8 lb

## 2022-09-22 DIAGNOSIS — Z0000 Encounter for general adult medical examination without abnormal findings: Secondary | ICD-10-CM | POA: Diagnosis not present

## 2022-09-22 DIAGNOSIS — R300 Dysuria: Secondary | ICD-10-CM

## 2022-09-22 DIAGNOSIS — Z1231 Encounter for screening mammogram for malignant neoplasm of breast: Secondary | ICD-10-CM | POA: Diagnosis not present

## 2022-09-22 LAB — POCT URINALYSIS DIP (MANUAL ENTRY)
Bilirubin, UA: NEGATIVE
Blood, UA: NEGATIVE
Glucose, UA: NEGATIVE mg/dL
Ketones, POC UA: NEGATIVE mg/dL
Leukocytes, UA: NEGATIVE
Nitrite, UA: NEGATIVE
Protein Ur, POC: NEGATIVE mg/dL
Spec Grav, UA: 1.02 (ref 1.010–1.025)
Urobilinogen, UA: 0.2 E.U./dL
pH, UA: 6 (ref 5.0–8.0)

## 2022-09-22 LAB — POCT GLYCOSYLATED HEMOGLOBIN (HGB A1C): Hemoglobin A1C: 5.6 % (ref 4.0–5.6)

## 2022-09-23 LAB — LIPID PANEL
Chol/HDL Ratio: 4.2 ratio (ref 0.0–4.4)
Cholesterol, Total: 196 mg/dL (ref 100–199)
HDL: 47 mg/dL (ref >39)
LDL Chol Calc (NIH): 127 mg/dL — ABNORMAL HIGH (ref 0–99)
Triglycerides: 123 mg/dL (ref 0–149)
VLDL Cholesterol Cal: 22 mg/dL (ref 5–40)

## 2022-09-23 LAB — HIV ANTIBODY (ROUTINE TESTING W REFLEX): HIV Screen 4th Generation wRfx: NONREACTIVE

## 2022-09-23 LAB — CBC
Hematocrit: 37.1 % (ref 34.0–46.6)
Hemoglobin: 11 g/dL — ABNORMAL LOW (ref 11.1–15.9)
MCH: 22.4 pg — ABNORMAL LOW (ref 26.6–33.0)
MCHC: 29.6 g/dL — ABNORMAL LOW (ref 31.5–35.7)
MCV: 76 fL — ABNORMAL LOW (ref 79–97)
Platelets: 353 10*3/uL (ref 150–450)
RBC: 4.91 x10E6/uL (ref 3.77–5.28)
RDW: 15.8 % — ABNORMAL HIGH (ref 11.7–15.4)
WBC: 4.6 10*3/uL (ref 3.4–10.8)

## 2022-09-23 LAB — HCV AB W REFLEX TO QUANT PCR: HCV Ab: NONREACTIVE

## 2022-09-23 LAB — HCV INTERPRETATION

## 2022-09-23 LAB — RPR: RPR Ser Ql: NONREACTIVE

## 2022-09-23 NOTE — Assessment & Plan Note (Signed)
Ongoing for weeks. No sign of infection on UA. No red flag symptoms; no concern for pyelonephritis. Patient to return if ongoing symptoms- can perform pelvic exam and obtain wet prep.

## 2022-10-14 ENCOUNTER — Other Ambulatory Visit (HOSPITAL_COMMUNITY)
Admission: RE | Admit: 2022-10-14 | Discharge: 2022-10-14 | Disposition: A | Discharge status: Home / Self Care | Payer: Medicaid Other | Source: Ambulatory Visit | Attending: Family Medicine | Admitting: Family Medicine

## 2022-10-14 ENCOUNTER — Other Ambulatory Visit: Payer: Self-pay

## 2022-10-14 ENCOUNTER — Ambulatory Visit: Payer: Medicaid Other | Admitting: Family Medicine

## 2022-10-14 VITALS — BP 133/84 | HR 99 | Wt 225.0 lb

## 2022-10-14 DIAGNOSIS — R32 Unspecified urinary incontinence: Secondary | ICD-10-CM | POA: Diagnosis not present

## 2022-10-14 DIAGNOSIS — R300 Dysuria: Secondary | ICD-10-CM

## 2022-10-14 DIAGNOSIS — R102 Pelvic and perineal pain: Secondary | ICD-10-CM

## 2022-10-14 LAB — POCT URINALYSIS DIP (MANUAL ENTRY)
Bilirubin, UA: NEGATIVE
Glucose, UA: NEGATIVE mg/dL
Ketones, POC UA: NEGATIVE mg/dL
Leukocytes, UA: NEGATIVE
Nitrite, UA: NEGATIVE
Protein Ur, POC: NEGATIVE mg/dL
Spec Grav, UA: 1.025 (ref 1.010–1.025)
Urobilinogen, UA: 0.2 E.U./dL
pH, UA: 7 (ref 5.0–8.0)

## 2022-10-14 LAB — POCT WET PREP (WET MOUNT)
Clue Cells Wet Prep Whiff POC: POSITIVE
Trichomonas Wet Prep HPF POC: ABSENT

## 2022-10-14 LAB — POCT UA - MICROSCOPIC ONLY: WBC, Ur, HPF, POC: NONE SEEN (ref 0–5)

## 2022-10-14 NOTE — Progress Notes (Signed)
    SUBJECTIVE:   CHIEF COMPLAINT / HPI:   Patient presents for follow up on her urinary symptoms. Was seen in clinic for dysuria on 10/4 but UA did not show infection. Was advised to return if symptoms persist. Today she states that she feels like she urinates often, has accidents on herself not able to make it to the restroom which has been happening for the past month. Denies dysuria or hematuria. Endorses sharp pains in pelvic area and in upper abdomen worsened a couple days ago. Denies vaginal irritation but does endorse an odor. Has history of tubal ligation after her c section in 2013.   PERTINENT  PMH / PSH: Reviewed   OBJECTIVE:   BP 133/84   Pulse 99   Wt 225 lb (102.1 kg)   SpO2 98%   BMI 38.62 kg/m    Physical exam General: well appearing, NAD Cardiovascular: RRR, no murmurs Lungs: CTAB. Normal WOB Abdomen: soft, non-distended, non-tender Skin: warm, dry. No edema Pelvic exam: normal external genitalia, vulva, vagina, cervix. Significant tenderness with speculum and manual exam.   ASSESSMENT/PLAN:   No problem-specific Assessment & Plan notes found for this encounter.   Urinary leakage  Pelvic pain  Patient presents with intermittent pelvic and upper abdominal pain as well as urinary leakage. Pelvic floor laxity likely contributing due to her vaginal deliveries but given significant vaginal and cervical motion tenderness on exam will check for STI. Considered PID though less likely given no isolated cervical motion tenderness but more so general pain during pelvic exam. Possibly interstitial cystitis given urinary and pelvic issues. Not concerned about pregnancy given tubal ligation and has not been sexually active in over a year. UA negative for infection. Wet prep significant for BV and will treat with Flagyl BID x 7 days. Will check for STIs and order pelvic ultrasound.    [place]

## 2022-10-14 NOTE — Patient Instructions (Signed)
It was great seeing you today!  You came in for urinary symptoms and abdominal pain and we are checking for STIs as well as your urine which could cause these symptoms as well. I will call you if anything is abnormal or will send a mychart message if normal.   Feel free to call with any questions or concerns at any time, at [phone].   Take care,  Dr. Shary Key [facility]

## 2022-10-15 ENCOUNTER — Telehealth: Payer: Self-pay | Admitting: Family Medicine

## 2022-10-15 ENCOUNTER — Telehealth: Payer: Self-pay | Admitting: Student

## 2022-10-15 DIAGNOSIS — R102 Pelvic and perineal pain: Secondary | ICD-10-CM | POA: Insufficient documentation

## 2022-10-15 LAB — CERVICOVAGINAL ANCILLARY ONLY
Chlamydia: NEGATIVE
Comment: NEGATIVE
Comment: NEGATIVE
Comment: NORMAL
Neisseria Gonorrhea: NEGATIVE
Trichomonas: NEGATIVE

## 2022-10-15 MED ORDER — METRONIDAZOLE 500 MG PO TABS: 500.0000 mg | ORAL_TABLET | Freq: Two times a day (BID) | ORAL | 0 refills | Status: AC

## 2022-10-15 NOTE — Assessment & Plan Note (Signed)
Patient presents with intermittent pelvic and upper abdominal pain as well as urinary leakage. Pelvic floor laxity likely contributing due to her vaginal deliveries but given significant vaginal and cervical motion tenderness on exam will check for STI. Considered PID though less likely given no isolated cervical motion tenderness but more so general pain during pelvic exam. Possibly interstitial cystitis given bladder given the urinary and pelvic issues. Not concerned about pregnancy given tubal ligation and has not been sexually active in over a year. UA negative for infection. Wet prep significant for BV and will treat with Flagyl BID x 7 days. Will check for STIs and order pelvic ultrasound.

## 2022-10-15 NOTE — Telephone Encounter (Signed)
Tamara Mccarthy walked in and wanted to speak with someone about her test results.  Concerned that she may need medication over the weekend.  She is in pain.   She saw Dr. Arby Barrette yesterday.   Please call [phone]

## 2022-10-18 ENCOUNTER — Ambulatory Visit
Admission: RE | Admit: 2022-10-18 | Discharge: 2022-10-18 | Disposition: A | Discharge status: Home / Self Care | Payer: Medicaid Other | Source: Ambulatory Visit | Attending: Family Medicine | Admitting: Family Medicine

## 2022-10-18 DIAGNOSIS — Z1231 Encounter for screening mammogram for malignant neoplasm of breast: Secondary | ICD-10-CM

## 2022-10-21 ENCOUNTER — Telehealth: Payer: Self-pay

## 2022-10-21 ENCOUNTER — Other Ambulatory Visit: Payer: Self-pay | Admitting: Family Medicine

## 2022-10-21 DIAGNOSIS — R928 Other abnormal and inconclusive findings on diagnostic imaging of breast: Secondary | ICD-10-CM

## 2022-10-21 NOTE — Telephone Encounter (Signed)
Patient calls nurse line reporting an adverse reaction to Flagyl.   Patient reports since she started the medication she has had a headache and reports feeling dizzy.   Patient reports she stopped taking the medication ~2 days ago, however her symptoms have not improved.   Patient is requesting an alternative medication. She reports she will not continue to take Flagyl.   She denies SOB, chest pains, nausea or syncope episodes.   Patient encouraged to schedule a clinic visit if her symptoms do not improve.   Will forward to provider who saw patient.

## 2022-10-23 ENCOUNTER — Other Ambulatory Visit: Payer: Self-pay | Admitting: Family Medicine

## 2022-10-23 MED ORDER — METRONIDAZOLE 0.75 % VA GEL: 1.0000 | Freq: Every day | VAGINAL | 0 refills | Status: DC

## 2022-11-03 ENCOUNTER — Ambulatory Visit: Admission: RE | Admit: 2022-11-03 | Payer: Medicaid Other | Source: Ambulatory Visit

## 2022-11-03 ENCOUNTER — Ambulatory Visit
Admission: RE | Admit: 2022-11-03 | Discharge: 2022-11-03 | Disposition: A | Discharge status: Home / Self Care | Payer: Medicaid Other | Source: Ambulatory Visit | Attending: Family Medicine | Admitting: Family Medicine

## 2022-11-03 DIAGNOSIS — R928 Other abnormal and inconclusive findings on diagnostic imaging of breast: Secondary | ICD-10-CM

## 2022-11-03 DIAGNOSIS — R922 Inconclusive mammogram: Secondary | ICD-10-CM | POA: Diagnosis not present

## 2022-11-22 DIAGNOSIS — F330 Major depressive disorder, recurrent, mild: Secondary | ICD-10-CM | POA: Diagnosis not present

## 2022-11-23 ENCOUNTER — Ambulatory Visit (INDEPENDENT_AMBULATORY_CARE_PROVIDER_SITE_OTHER): Payer: Medicaid Other | Admitting: Student

## 2022-11-23 ENCOUNTER — Encounter: Payer: Self-pay | Admitting: Student

## 2022-11-23 VITALS — BP 129/81 | HR 99 | Wt 219.0 lb

## 2022-11-23 DIAGNOSIS — D649 Anemia, unspecified: Secondary | ICD-10-CM

## 2022-11-23 DIAGNOSIS — N939 Abnormal uterine and vaginal bleeding, unspecified: Secondary | ICD-10-CM | POA: Diagnosis not present

## 2022-11-23 LAB — POCT HEMOGLOBIN: Hemoglobin: 12.3 g/dL (ref 11–14.6)

## 2022-11-23 MED ORDER — MEDROXYPROGESTERONE ACETATE 10 MG PO TABS: 10.0000 mg | ORAL_TABLET | Freq: Every day | ORAL | 0 refills | Status: DC

## 2022-11-23 NOTE — Patient Instructions (Addendum)
It was great to see you today! Thank you for choosing [facility] for your primary care. Tamara Mccarthy was seen for for follow up.  Today we addressed: Continue with Provera 10 mg daily for 10 days until you see the OBGYN doctor  Please return if you experience shortness of breath, dizziness, confusion, fatigue   If you haven't already, sign up for My Chart to have easy access to your labs results, and communication with your primary care physician.  I recommend that you always bring your medications to each appointment as this makes it easy to ensure you are on the correct medications and helps [place] not miss refills when you need them. Call the clinic at [phone] if your symptoms worsen or you have any concerns.  You should return to our clinic Return in about 4 weeks (around 12/21/2022) for 1-2 months. Please arrive 15 minutes before your appointment to ensure smooth check in process.  We appreciate your efforts in making this happen.  Thank you for allowing me to participate in your care, Alfredo Martinez, MD 11/23/2022, 3:23 PM PGY-2, [facility]

## 2022-11-23 NOTE — Progress Notes (Signed)
  SUBJECTIVE:   CHIEF COMPLAINT / HPI:   Patient is a L2G4010 with sterilization, having abnormal uterine bleeding. Was seen at [facility] for this problem early in 2023.   Pelvic U/S 08/2020:  Heterogeneous endometrial complex 17 mm thick; if bleeding remains unresponsive to hormonal or medical therapy, focal lesion work-up with sonohysterogram should be considered. Endometrial biopsy should also be considered in pre-menopausal patients at high risk for endometrial carcinoma.   Patient reports today that she is on birth control.   Changes her pads every 30 minutes, bleeding like this the last 4 weeks   Patient got an IUD last year for continued AUB but did not tolerate it. She trial-ed Megace 40 mg BID and saw benefit from it. Then the bleeding started again. She received a D&C with [facility] and was started on Loestrin. She saw benefit from this until November 2023 with normal cycles. She has been fully saturating pads and replacing them every ~30 minutes for about 1 month.   Patient is fairly fatigued now as well.     PERTINENT  PMH / PSH:   Past Medical History:  Diagnosis Date   Acid reflux 2009   Anxiety 2004   Asthma 2013   High cholesterol 2020   Hypertension 2020    OBJECTIVE:  BP 129/81   Pulse 99   Wt 219 lb (99.3 kg)   LMP 10/20/2022 (Approximate)   SpO2 100%   BMI 37.59 kg/m  Physical Exam Vitals reviewed.  Constitutional:      Appearance: Normal appearance.  HENT:     Head: Normocephalic.     Nose: Nose normal.     Mouth/Throat:     Mouth: Mucous membranes are moist.  Eyes:     Conjunctiva/sclera: Conjunctivae normal.  Cardiovascular:     Rate and Rhythm: Normal rate and regular rhythm.  Pulmonary:     Effort: Pulmonary effort is normal.     Breath sounds: Normal breath sounds.  Genitourinary:    General: Normal vulva.     Vagina: No vaginal discharge.     Comments: Vaginal bleeding without ulcerations or rash No mass on pelvic  exam Musculoskeletal:     Cervical back: Normal range of motion.  Skin:    Capillary Refill: Capillary refill takes less than 2 seconds.  Neurological:     General: No focal deficit present.     Mental Status: She is alert.  Psychiatric:        Mood and Affect: Mood normal.        Behavior: Behavior normal.      ASSESSMENT/PLAN:  Abnormal uterine bleeding (AUB) -     Ambulatory referral to Gynecology -     medroxyPROGESTERone Acetate; Take 1 tablet (10 mg total) by mouth daily.  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 Pelvic U/S previously noted in H&P. Patient would likely benefit from ablation vs TVH. Will order Provera to stop the bleeding (no severe anemia based on POC Hgb) and urgently refer to [facility] for further assessment and discuss options.   Return in about 4 weeks (around 12/21/2022) for 1-2 months. Alfredo Martinez, MD 11/23/2022, 4:35 PM PGY-2, [facility]

## 2022-11-23 NOTE — Assessment & Plan Note (Signed)
POC Hgb 12.3

## 2022-11-26 ENCOUNTER — Other Ambulatory Visit (HOSPITAL_BASED_OUTPATIENT_CLINIC_OR_DEPARTMENT_OTHER): Payer: Self-pay

## 2022-11-26 ENCOUNTER — Ambulatory Visit (INDEPENDENT_AMBULATORY_CARE_PROVIDER_SITE_OTHER): Payer: Medicaid Other | Admitting: Pulmonary Disease

## 2022-11-26 ENCOUNTER — Encounter (HOSPITAL_BASED_OUTPATIENT_CLINIC_OR_DEPARTMENT_OTHER): Payer: Self-pay | Admitting: Pulmonary Disease

## 2022-11-26 VITALS — BP 110/72 | HR 92 | Temp 98.3°F | Ht 64.0 in | Wt 217.2 lb

## 2022-11-26 DIAGNOSIS — J4551 Severe persistent asthma with (acute) exacerbation: Secondary | ICD-10-CM

## 2022-11-26 DIAGNOSIS — J45998 Other asthma: Secondary | ICD-10-CM | POA: Diagnosis not present

## 2022-11-26 LAB — PULMONARY FUNCTION TEST
FEF 25-75 Pre: 2.56 L/sec
FEF2575-%Pred-Pre: 81 %
FEV1-%Pred-Pre: 91 %
FEV1-Pre: 2.76 L
FEV1FVC-%Pred-Pre: 97 %
FEV6-%Pred-Pre: 94 %
FEV6-Pre: 3.42 L
FEV6FVC-%Pred-Pre: 101 %
FVC-%Pred-Pre: 93 %
FVC-Pre: 3.44 L
Pre FEV1/FVC ratio: 80 %
Pre FEV6/FVC Ratio: 100 %

## 2022-11-26 MED ORDER — CETIRIZINE HCL 10 MG PO TABS: ORAL_TABLET | ORAL | 11 refills | Status: DC

## 2022-11-26 MED ORDER — BUDESONIDE-FORMOTEROL FUMARATE 160-4.5 MCG/ACT IN AERO: 2.0000 | INHALATION_SPRAY | Freq: Every day | RESPIRATORY_TRACT | 3 refills | Status: AC

## 2022-11-26 MED ORDER — ALBUTEROL SULFATE HFA 108 (90 BASE) MCG/ACT IN AERS: 2.0000 | INHALATION_SPRAY | RESPIRATORY_TRACT | 0 refills | Status: AC | PRN

## 2022-11-26 MED ORDER — IPRATROPIUM-ALBUTEROL 0.5-2.5 (3) MG/3ML IN SOLN: 3.0000 mL | RESPIRATORY_TRACT | 3 refills | Status: AC | PRN

## 2022-11-26 NOTE — Progress Notes (Addendum)
Subjective:    Patient ID: Tamara Mccarthy, female    DOB: 12/22/1980, 41 y.o.   MRN: [id number]  HPI  Chief Complaint  Patient presents with   Consult    Pt states she transferred from the [facility]. Pt has a history of SOB. Pt states she has SOB with exertion daily at work.    41 year old remote smoker presents to establish care for severe persistent asthma.  She has moved from [place] to [place] 4 months ago.  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 She reports a diagnosis of asthma for 10 years, persistent symptoms, triggers being pollen during spring and cigarette smoke.  She is maintained on regimen of Symbicort with albuterol/ProAir for rescue.  She reports shortness of breath while working, when seasons change and occasionally while sleeping on her back.  She prefers to sleep on her side. Trelegy was not covered on her insurance. She reports worsening breathing during COVID infection February 2023, it took her about 4 months to recover.  She had previous COVID infection in 2020 which was mild  She currently uses her albuterol MDI 3-4 times a week and nebs about twice a week.  Her last flareup requiring prednisone was 02/2022 I reviewed PCP and prior pulmonologist records  Significant tests/ events reviewed  PFTs 12/2013 minimal airway obstruction reported  PFTs 04/2022 - no airway obs , DLCO 27.6/70% Spirometry 11/2022 FEV1 91%, no obstruction  Eosinophils 142 IgE 11 RAST 02/2022 positive for Aspergillus and Alternaria  Past Medical History:  Diagnosis Date   Acid reflux 2009   Anxiety 2004   Asthma 2013   High cholesterol 2020   Hypertension 2020    Past Surgical History:  Procedure Laterality Date   CESAREAN SECTION  2002   CESAREAN SECTION  2008   CESAREAN SECTION  2011   CESAREAN SECTION  2013   HERNIA REPAIR  2005   [place], [place]   No Known Allergies  Social History   Socioeconomic History   Marital status: Single     Spouse name: Not on file   Number of children: Not on file   Years of education: Not on file   Highest education level: Not on file  Occupational History   Not on file  Tobacco Use   Smoking status: Former    Types: Cigarettes, E-cigarettes    Quit date: 2004    Years since quitting: 19.9   Smokeless tobacco: Never  Substance and Sexual Activity   Alcohol use: Never   Drug use: Never   Sexual activity: Not Currently    Birth control/protection: Surgical  Other Topics Concern   Not on file  Social History Narrative   Hobbies: writing poems, cooking, braiding hair, going to the beach and the movies.Lives with three boys (12, 9, 8). Also has a daughter. Not working right now.  Moved to [place] for a change from [place], [place]    Social Determinants of Health   Financial Resource Strain: Not on file  Food Insecurity: Not on file  Transportation Needs: Not on file  Physical Activity: Not on file  Stress: Not on file  Social Connections: Not on file  Intimate Partner Violence: Not on file    Family History  Problem Relation Age of Onset   Alcohol abuse Mother    Depression Mother    Hypertension Mother    Drug abuse Mother    Alcohol abuse Father    Depression Father  Drug abuse Father    Depression Maternal Grandmother    Depression Maternal Grandfather    Breast cancer Neg Hx      Review of Systems Constitutional: negative for anorexia, fevers and sweats  Eyes: negative for irritation, redness and visual disturbance  Ears, nose, mouth, throat, and face: negative for earaches, epistaxis, nasal congestion and sore throat  Respiratory: negative for cough, dyspnea on exertion, sputum and wheezing  Cardiovascular: negative for chest pain, dyspnea, lower extremity edema, orthopnea, palpitations and syncope  Gastrointestinal: negative for abdominal pain, constipation, diarrhea, melena, nausea and vomiting  Genitourinary:negative for dysuria, frequency and hematuria   Hematologic/lymphatic: negative for bleeding, easy bruising and lymphadenopathy  Musculoskeletal:negative for arthralgias, muscle weakness and stiff joints  Neurological: negative for coordination problems, gait problems, headaches and weakness  Endocrine: negative for diabetic symptoms including polydipsia, polyuria and weight loss     Objective:   Physical Exam  Gen. Pleasant, obese, in no distress, normal affect ENT - no pallor,icterus, no post nasal drip, class 2-3 airway Neck: No JVD, no thyromegaly, no carotid bruits Lungs: no use of accessory muscles, no dullness to percussion, decreased without rales or rhonchi  Cardiovascular: Rhythm regular, heart sounds  normal, no murmurs or gallops, no peripheral edema Abdomen: soft and non-tender, no hepatosplenomegaly, BS normal. Musculoskeletal: No deformities, no cyanosis or clubbing Neuro:  alert, non focal, no tremors        Assessment & Plan:

## 2022-11-26 NOTE — Assessment & Plan Note (Signed)
She has persistent symptoms and will continue on her Symbicort maintenance inhaler. Spirometry does not show any airway obstruction today which is reassuring. She does seem to have an allergic phenotype but worsening during spring and fall and she will be maintained on Singulair and OTC antihistaminic during the seasons.  I do not feel a biologic will be necessary. Concerned about her need for nebulizer twice a week Refills will be provided today on all of her medications We will see her back in spring

## 2022-11-26 NOTE — Patient Instructions (Addendum)
X spirometry   X Obtain PFTs prior  X Refills on symbicort, pro-air, singulair & duo nebs

## 2022-11-26 NOTE — Patient Instructions (Signed)
Spirometry Performed Today.  

## 2022-11-26 NOTE — Progress Notes (Signed)
Spirometry Performed Today.  

## 2022-12-31 DIAGNOSIS — F330 Major depressive disorder, recurrent, mild: Secondary | ICD-10-CM | POA: Diagnosis not present

## 2023-01-05 ENCOUNTER — Ambulatory Visit (INDEPENDENT_AMBULATORY_CARE_PROVIDER_SITE_OTHER): Payer: Medicaid Other | Admitting: Family Medicine

## 2023-01-05 VITALS — BP 116/69 | HR 99 | Temp 98.2°F | Ht 64.0 in | Wt 221.4 lb

## 2023-01-05 DIAGNOSIS — J0110 Acute frontal sinusitis, unspecified: Secondary | ICD-10-CM | POA: Diagnosis not present

## 2023-01-05 MED ORDER — SULFAMETHOXAZOLE-TRIMETHOPRIM 800-160 MG PO TABS: 1.0000 | ORAL_TABLET | Freq: Two times a day (BID) | ORAL | 0 refills | Status: DC

## 2023-01-05 NOTE — Patient Instructions (Signed)
Continue your nebulizer treatments. I am adding an antibiotic.  You should get better over the next 5-7 days.  For your ears, I recommend OTC debrox. Use it in your ear canals 2-3 times a week.

## 2023-01-05 NOTE — Progress Notes (Signed)
    CHIEF COMPLAINT / HPI:   7010 days of sinus pressure, nasal congestion with green nasal discharge.  Ears feel stuffed up.  Lungs feel tight but no wheezes.  Does have a history of asthma.  Cough only with postnasal drainage.  She has noticed some left ear fullness and thought she had some drainage from the left ear yesterday.  Had some fever few days ago but that has seemed to resolve.  Is having some fatigue.  PERTINENT  PMH / PSH: I have reviewed the patient's medications, allergies, past medical and surgical history, smoking status and updated in the EMR as appropriate. History of asthma persistent controlled  OBJECTIVE:  BP 116/69   Pulse 99   Temp 98.2 F (36.8 C)   Ht 5\' 4"  (1.626 m)   Wt 221 lb 6.4 oz (100.4 kg)   LMP 12/22/2022   SpO2 100%   BMI 38.00 kg/m  GENERAL: Well-developed female no acute distress HEENT: TMs bilaterally seem to be somewhat retracted.  The left TM is poorly seen secondary to cerumen impaction.  Ear canals without any exudate.  Frontal sinuses tender to palpation.  Neck full range of motion CV: Regular rate and rhythm LUNGS: No wheeze.  Lungs reveal good sounds in all lung fields with normal work of breathing.  ASSESSMENT / PLAN: #1.  Sinusitis 2.  Moderate cerumen impaction Plan: Bactrim DS twice daily x 7 days.  Debrox for cerumen.  Follow-up if not improving.  No problem-specific Assessment & Plan notes found for this encounter.   Dorcas Mcmurray MD

## 2023-01-28 ENCOUNTER — Ambulatory Visit (INDEPENDENT_AMBULATORY_CARE_PROVIDER_SITE_OTHER): Payer: Medicaid Other | Admitting: Family Medicine

## 2023-01-28 VITALS — BP 137/80 | HR 90 | Temp 98.5°F | Ht 64.0 in | Wt 218.6 lb

## 2023-01-28 DIAGNOSIS — R030 Elevated blood-pressure reading, without diagnosis of hypertension: Secondary | ICD-10-CM

## 2023-01-28 DIAGNOSIS — B3731 Acute candidiasis of vulva and vagina: Secondary | ICD-10-CM | POA: Diagnosis not present

## 2023-01-28 DIAGNOSIS — T192XXA Foreign body in vulva and vagina, initial encounter: Secondary | ICD-10-CM | POA: Diagnosis not present

## 2023-01-28 MED ORDER — FLUCONAZOLE 150 MG PO TABS: 150.0000 mg | ORAL_TABLET | Freq: Once | ORAL | 0 refills | Status: AC

## 2023-01-28 NOTE — Patient Instructions (Addendum)
You have a little discharge going on, I am going to go ahead and send in a medication called Diflucan, which is a one-time dose to try and treat yeast infection.  If you notice any vaginal irritation, odor or discharge within the next week, please come back to the office today can have you tested further.  I would recommend keeping an eye on your blood pressures, it was a little bit elevated in the office today, which is understandable.  If you check it at home or at a pharmacy and noticed that the blood pressures are > 140/90, then I would recommend coming back in and seeing your PCP.

## 2023-01-28 NOTE — Progress Notes (Signed)
    SUBJECTIVE:   CHIEF COMPLAINT / HPI:   Foreign object in vagina  - condom present in canal for 2 days - has not noticed any increase in discharge - is having some irritation - has tubal ligation so did not need back-up birth control  PERTINENT  PMH / PSH: Reviewed   OBJECTIVE:   BP 137/80   Pulse 90   Temp 98.5 F (36.9 C)   Ht 5\' 4"  (1.626 m)   Wt 218 lb 9.6 oz (99.2 kg)   LMP 12/22/2022 (Exact Date)   SpO2 99%   BMI 37.52 kg/m   General: NAD, well-appearing, well-nourished Respiratory: No respiratory distress, breathing comfortably, able to speak in full sentences Skin: warm and dry, no rashes noted on exposed skin Psych: Appropriate affect and mood Pelvic exam: VULVA: normal appearing vulva with no masses, tenderness or lesions, VAGINA: vaginal discharge - white and curd-like, condom present in canal, CERVIX: normal appearing cervix without discharge or lesions, exam chaperoned by Leonia Corona, [place].  ASSESSMENT/PLAN:   Vaginal foreign object Condom in vaginal canal, removed with ringed forceps. Condom was intact and was easily removed. Patient has BTL so no further birth control was required.   Vaginal candidiasis  Vaginal discharge in canal consistent with candidiasis.  - Diflucan 150mg  x1 dose prescribed - Follow-up if changes in discharge or odor over the next week   Elevated blood pressure Likely in the setting of anxiety. Past BP measurements reviewed and little concern for HTN at this time.  - discussed with patient to check at home or at pharmacy  - follow-up with PCP if elevated >140/90   Rise Patience, [place]

## 2023-02-11 DIAGNOSIS — F330 Major depressive disorder, recurrent, mild: Secondary | ICD-10-CM | POA: Diagnosis not present

## 2023-02-16 ENCOUNTER — Ambulatory Visit (INDEPENDENT_AMBULATORY_CARE_PROVIDER_SITE_OTHER): Payer: Medicaid Other | Admitting: Student

## 2023-02-16 ENCOUNTER — Encounter: Payer: Self-pay | Admitting: Student

## 2023-02-16 VITALS — BP 110/66 | HR 92 | Wt 221.4 lb

## 2023-02-16 DIAGNOSIS — N939 Abnormal uterine and vaginal bleeding, unspecified: Secondary | ICD-10-CM | POA: Diagnosis not present

## 2023-02-16 DIAGNOSIS — R7303 Prediabetes: Secondary | ICD-10-CM

## 2023-02-16 DIAGNOSIS — Z139 Encounter for screening, unspecified: Secondary | ICD-10-CM | POA: Diagnosis not present

## 2023-02-16 DIAGNOSIS — E785 Hyperlipidemia, unspecified: Secondary | ICD-10-CM

## 2023-02-16 DIAGNOSIS — D649 Anemia, unspecified: Secondary | ICD-10-CM

## 2023-02-16 DIAGNOSIS — Z3200 Encounter for pregnancy test, result unknown: Secondary | ICD-10-CM

## 2023-02-16 LAB — POCT HEMOGLOBIN: Hemoglobin: 10.2 g/dL — AB (ref 11–14.6)

## 2023-02-16 LAB — POCT URINE PREGNANCY: Preg Test, Ur: NEGATIVE

## 2023-02-16 MED ORDER — IRON (FERROUS SULFATE) 325 (65 FE) MG PO TABS: 1.0000 | ORAL_TABLET | ORAL | 11 refills | Status: DC

## 2023-02-16 MED ORDER — NORETHINDRONE ACET-ETHINYL EST 1.5-30 MG-MCG PO TABS: 1.0000 | ORAL_TABLET | Freq: Every day | ORAL | 11 refills | Status: DC

## 2023-02-16 MED ORDER — FOLIC ACID 1 MG PO TABS: 1.0000 mg | ORAL_TABLET | Freq: Every day | ORAL | 3 refills | Status: AC
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
## 2023-02-16 NOTE — Patient Instructions (Addendum)
It was great seeing you today.  I refilled your medicines for you.  Please follow-up with me in a few months so I can see how you are doing with your anxiety.   If you have any questions or concerns, please feel free to call the clinic.    Be well,  Dr. Orvis Brill [facility] [phone]

## 2023-02-16 NOTE — Assessment & Plan Note (Signed)
Stable Encouraged to continue OCP as well as ferous sulfate 325 mg

## 2023-02-17 LAB — CBC
Hematocrit: 34.1 % (ref 34.0–46.6)
Hemoglobin: 10.4 g/dL — ABNORMAL LOW (ref 11.1–15.9)
MCH: 22.3 pg — ABNORMAL LOW (ref 26.6–33.0)
MCHC: 30.5 g/dL — ABNORMAL LOW (ref 31.5–35.7)
MCV: 73 fL — ABNORMAL LOW (ref 79–97)
Platelets: 494 10*3/uL — ABNORMAL HIGH (ref 150–450)
RBC: 4.67 x10E6/uL (ref 3.77–5.28)
RDW: 15.1 % (ref 11.7–15.4)
WBC: 6.4 10*3/uL (ref 3.4–10.8)

## 2023-02-17 LAB — HEMOGLOBIN A1C
Est. average glucose Bld gHb Est-mCnc: 126 mg/dL
Hgb A1c MFr Bld: 6 % — ABNORMAL HIGH (ref 4.8–5.6)

## 2023-02-17 LAB — LDL CHOLESTEROL, DIRECT: LDL Direct: 116 mg/dL — ABNORMAL HIGH (ref 0–99)

## 2023-02-21 ENCOUNTER — Other Ambulatory Visit: Payer: Self-pay | Admitting: Student

## 2023-02-21 MED ORDER — IRON (FERROUS SULFATE) 325 (65 FE) MG PO TABS: 1.0000 | ORAL_TABLET | ORAL | 11 refills | Status: AC

## 2023-02-21 MED ORDER — CETIRIZINE HCL 10 MG PO TABS: ORAL_TABLET | ORAL | 11 refills | Status: AC

## 2023-04-08 ENCOUNTER — Ambulatory Visit: Payer: Medicaid Other

## 2023-04-11 ENCOUNTER — Ambulatory Visit: Payer: Medicaid Other | Admitting: Family Medicine

## 2023-04-11 ENCOUNTER — Telehealth: Payer: Self-pay

## 2023-04-11 VITALS — BP 127/74 | HR 69 | Ht 64.0 in | Wt 226.2 lb

## 2023-04-11 DIAGNOSIS — N939 Abnormal uterine and vaginal bleeding, unspecified: Secondary | ICD-10-CM | POA: Diagnosis not present

## 2023-04-11 MED ORDER — NAPROXEN 500 MG PO TABS
500.0000 mg | ORAL_TABLET | Freq: Two times a day (BID) | ORAL | 0 refills | Status: AC
Start: 2023-04-11 — End: 2023-04-16

## 2023-04-11 NOTE — Patient Instructions (Addendum)
It was great seeing you today!  Today we discussed your abnormal menstrual bleeding, I have prescribed naproxen 500 mg (please take this twice daily for 5 days).   We will also check your blood count to check your hemoglobin to monitor your anemia.   Please follow up at your next scheduled appointment on 05/23/2023 at 9:10 am, if anything arises between now and then, please don't hesitate to contact our office.   Thank you for allowing [place] to be a part of your medical care!  Thank you, Dr. Robyne Peers

## 2023-04-11 NOTE — Assessment & Plan Note (Signed)
-  seems to be an acute on chronic condition -will trial naproxen 500 mg bid for 5 days -continue OCPs -follow up with PCP scheduled on 6/3, consider transvaginal US if symptoms persist to evaluate for potential endometrial biopsy

## 2023-04-11 NOTE — Assessment & Plan Note (Signed)
-  pending CBC to monitor anemia -continue iron supplementation 3x a week -strict return and ED precautions discussed

## 2023-04-11 NOTE — Progress Notes (Signed)
    SUBJECTIVE:   CHIEF COMPLAINT / HPI:   Patient presents with concern of continued irregular menstrual bleeding. Has been ongoing since 2021. Prior to this has regular menstrual cycles. At the time, she had continuous cycles for the past 6 months, then improved to 4 weeks which seems to still be the case since 2022. It was normal for about 5 months in the interim during this period. Was on megace but has been off of this for the past 2 years. Saturates pads and has to change every 30-60 minutes. This has been stable since her last visit but improves with birth control then starts again. Currently on OCPs for contraception since November 2022. Had IUD in the past which did not help and caused further bleeding.   OBJECTIVE:   BP 127/74   Pulse 69   Ht  (1.626 m)   Wt 226 lb 4 oz (102.6 kg)   SpO2 100%   BMI 38.84 kg/m   General: Patient well-appearing, in no acute distress. Resp: normal work of breathing noted Psych: mood appropriate, pleasant   ASSESSMENT/PLAN:   Abnormal uterine bleeding (AUB) -seems to be an acute on chronic condition -will trial naproxen 500 mg bid for 5 days -continue OCPs -follow up with PCP scheduled on 6/3, consider transvaginal US if symptoms persist to evaluate for potential endometrial biopsy   Anemia -pending CBC to monitor anemia -continue iron supplementation 3x a week -strict return and ED precautions discussed     Reece Leader, DO [facility]

## 2023-04-11 NOTE — Telephone Encounter (Signed)
Patient walks into [facility] to speak with a nurse.   She reports she would like a prescription to help stop her AUB. She reports she was given a medication for this "a few months ago." She reports that's what she thought she was getting today after visit. However, reports "only pain medication was sent."   Advised will forward to provider who saw patient for advisement.   Patient reports she will call us tomorrow as she does not have a working phone at the moment.

## 2023-04-12 LAB — CBC
Hematocrit: 37.3 % (ref 34.0–46.6)
Hemoglobin: 11.2 g/dL (ref 11.1–15.9)
MCH: 23 pg — ABNORMAL LOW (ref 26.6–33.0)
MCHC: 30 g/dL — ABNORMAL LOW (ref 31.5–35.7)
MCV: 77 fL — ABNORMAL LOW (ref 79–97)
Platelets: 458 10*3/uL — ABNORMAL HIGH (ref 150–450)
RBC: 4.86 x10E6/uL (ref 3.77–5.28)
RDW: 18.6 % — ABNORMAL HIGH (ref 11.7–15.4)
WBC: 6.3 10*3/uL (ref 3.4–10.8)

## 2023-04-12 NOTE — Telephone Encounter (Signed)
Patient calls nurse line.  Patient advised of recommendation of medication. Advised to try this for a few days and to call if no symptom improvement.   Patient is requesting CBC results. Please send her a [employer].

## 2023-04-26 DIAGNOSIS — H5213 Myopia, bilateral: Secondary | ICD-10-CM | POA: Diagnosis not present

## 2023-05-10 DIAGNOSIS — H5213 Myopia, bilateral: Secondary | ICD-10-CM | POA: Diagnosis not present

## 2023-05-23 ENCOUNTER — Encounter: Payer: Self-pay | Admitting: Student

## 2023-05-23 ENCOUNTER — Ambulatory Visit (INDEPENDENT_AMBULATORY_CARE_PROVIDER_SITE_OTHER): Payer: Medicaid Other | Admitting: Student

## 2023-05-23 ENCOUNTER — Ambulatory Visit (HOSPITAL_BASED_OUTPATIENT_CLINIC_OR_DEPARTMENT_OTHER): Payer: Medicaid Other | Admitting: Pulmonary Disease

## 2023-05-23 VITALS — BP 112/60 | HR 101 | Ht 64.0 in | Wt 227.2 lb

## 2023-05-23 DIAGNOSIS — Z139 Encounter for screening, unspecified: Secondary | ICD-10-CM | POA: Diagnosis not present

## 2023-05-23 DIAGNOSIS — N939 Abnormal uterine and vaginal bleeding, unspecified: Secondary | ICD-10-CM | POA: Diagnosis not present

## 2023-05-23 NOTE — Patient Instructions (Addendum)
It was great to see you! Thank you for allowing me to participate in your care!  I recommend that you always bring your medications to each appointment as this makes it easy to ensure we are on the correct medications and helps [place] not miss when refills are needed.  Our plans for today:  - Stay hydrated  - Continue taking your iron pills daily - Let us know if you have any issues with bleeding in the future - Please follow up with me in 1 month- schedule your appointment before you leave today  Take care and seek immediate care sooner if you develop any concerns.   Dr. Darral Dash, DO [facility]     Housing Resources:   [facility]:   [email or web address]-[email or web address]/  [phone]   [address] [place], [place], [place] [address]    [facility] Housing Search [email or web address] [place]   [phone]   [facility] Housing Authority:  [phone]     [address] [place], [place], [place] [address]      [facility] Housing Authority:  [phone]     [address] Fransisca Kaufmann [place], [place] [address]     Affordable Housing Management:  [phone]  http://[email or web address].[email or web address]    [address] [place], [place] [address]   Homeless: [facility] Strategy [facility]) [phone]   [address] [employer]     [facility]: [facility] Emergency Shelter By appointment only - Call [phone]    [facility] ([facility])   [address] [employer] [address] [phone]   [facility]  [address] [phone]   [facility] Family Shelter  [place]  [phone]      Housing Repair   [employer] (Home repair program)  [email or web address]/    [address]  Gwyndolyn Kaufman [place], [place] [address]   [phone]  (For homeowners only)   [employer]  [phone] and older)  [phone] ext [phone]   Resources for Unhoused Persons Coordinated Entry: For Unhoused Persons in [facility]  CIT Group Entry if [employer];  Living In A Place Not Meant For  [employer] (Outside, Set designer, [place], Catering manager.) Living In A Building With No Technical brewer Living In A Shelter Living In A Hotel/Motel Paid For By [facility]  What Is Coordinated Entry? [place]'s homeless service agencies working together to ensure those experiencinghomelessness are quickly connected to services.  Visit Our [employer] *Access Points Will Not Occur In Event of Inclement Weather or If Space Is Closed to Public  Every Tuesday 9am-10am [facility] [address], [place] [address]  Every Wednesday [facility] [address], [place] [address]  Email Coordinated Entry [email or web address] [email or web address]  Call Our Coordinated Entry Line [phone]    Possible Utility Resources                                                                              Welfare Reform Liaison Project  www.WRLP.net  (513)004-6047   [facility], [address] [place] [phone] or [phone] (some housing help available, priority goes to [facility] [employer] shelter residents with an income)   [facility]  Department of Social Services (assistance available as funding permits)   [address] or [address] [facility] [phone]    [employer] NFA[employer]ZHY [employer] VHQI Society [address] [phone]   (only, as funding permits)   [employer] [phone] 8:30-5:00   [facility] [phone] Venita Lick, Th 10:00am-2:00pm   [facility] [phone], extension [phone]  T, Th 10:00am-1:00pm   [facility]), [phone]   [facility]), [address] [phone]

## 2023-05-23 NOTE — Assessment & Plan Note (Signed)
Has safety plan, and a safe house to go to Provided with list of resources Follow-up in 1 month
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
## 2023-05-23 NOTE — Assessment & Plan Note (Signed)
Improved. Observe for now. - Continue PO ferrous sulfate - If bleeding returns/persists, will trial Megace and return to OB for further evaluation (sonohysterogram) - Discussed use of NSAIDs

## 2023-06-22 ENCOUNTER — Ambulatory Visit: Payer: Medicaid Other | Admitting: Family Medicine

## 2023-06-22 ENCOUNTER — Encounter: Payer: Self-pay | Admitting: Family Medicine

## 2023-06-22 VITALS — BP 120/70 | HR 92 | Ht 64.0 in | Wt 226.0 lb

## 2023-06-22 DIAGNOSIS — R7303 Prediabetes: Secondary | ICD-10-CM

## 2023-06-22 DIAGNOSIS — N939 Abnormal uterine and vaginal bleeding, unspecified: Secondary | ICD-10-CM

## 2023-06-22 LAB — POCT HEMOGLOBIN: Hemoglobin: 11.3 g/dL (ref 11–14.6)

## 2023-06-22 MED ORDER — MEDROXYPROGESTERONE ACETATE 10 MG PO TABS: 10.0000 mg | ORAL_TABLET | Freq: Every day | ORAL | 1 refills | Status: AC

## 2023-06-22 NOTE — Progress Notes (Signed)
    CHIEF COMPLAINT / HPI:  Continued intermittent vaginal bleeding. Has been going on for years. Recently it has been heavier. She was told at one time she should have hysterectomy but at that time she had a lot going on and could not pursue the tretament plan. She would actually like to consider hysterectomy now as she is so tired of  the bleeding and has no desire for any mopre children.   PERTINENT  PMH / PSH: I have reviewed the patient's medications, allergies, past medical and surgical history, smoking status and updated in the EMR as appropriate.   OBJECTIVE:  BP 120/70   Pulse 92   Ht 5\' 4"  (1.626 m)   Wt 226 lb (102.5 kg)   LMP 05/11/2023   BMI 38.79 kg/m  WD WN NAD  ASSESSMENT / PLAN:   Abnormal uterine bleeding (AUB) Checked hemoglobin today and it is stable.  Will set her up for complete pelvic ultrasound.  I suspect she may have either fibroids or adenomyosis.  Once I have received the results of her ultrasound, I will call her.  Okay to leave message on her cell phone.  Likely will end up referring to gynecology for evaluation for possible hysterectomy.  For now I will refill her Provera as she is bleeding quite heavily.  She continues on ibuprofen and OCPs.   Denny Levy MD

## 2023-06-22 NOTE — Assessment & Plan Note (Signed)
Checked hemoglobin today and it is stable.  Will set her up for complete pelvic ultrasound.  I suspect she may have either fibroids or adenomyosis.  Once I have received the results of her ultrasound, I will call her.  Okay to leave message on her cell phone.  Likely will end up referring to gynecology for evaluation for possible hysterectomy.  For now I will refill her Provera as she is bleeding quite heavily.  She continues on ibuprofen and OCPs.

## 2023-06-22 NOTE — Patient Instructions (Signed)
As we discussed, I think we need to proceed and get the pelvic ultrasound.  If you have not heard from me within 48 hours of the test, please give the office a call.  Will discuss her next options.  I am also calling in an additional prescription for 5 days for the Provera as your having such heavy bleeding now.  Please call in the interim if you have any questions.  It was great to see you!

## 2023-06-29 ENCOUNTER — Ambulatory Visit (HOSPITAL_COMMUNITY)
Admission: RE | Admit: 2023-06-29 | Discharge: 2023-06-29 | Disposition: A | Discharge status: Home / Self Care | Payer: Medicaid Other | Source: Ambulatory Visit | Attending: Family Medicine | Admitting: Family Medicine

## 2023-06-29 DIAGNOSIS — N939 Abnormal uterine and vaginal bleeding, unspecified: Secondary | ICD-10-CM | POA: Diagnosis present

## 2023-07-04 ENCOUNTER — Telehealth: Payer: Self-pay

## 2023-07-04 NOTE — Telephone Encounter (Signed)
Patient calls nurse line requesting results from [place].   We do not have the report back yet from Radiology. Advised this has been taking longer than usual, however will call once we have results.   She reports she is out of Provera. She reports she took this for 5 days. I advised she has a refill on the prescription. She reports this has helped some and she will renew prescription today.   Will advise on [place] results once received.

## 2023-07-05 ENCOUNTER — Encounter: Payer: Self-pay | Admitting: Family Medicine

## 2023-07-05 ENCOUNTER — Telehealth: Payer: Self-pay

## 2023-07-05 DIAGNOSIS — R102 Pelvic and perineal pain: Secondary | ICD-10-CM

## 2023-07-05 NOTE — Telephone Encounter (Signed)
Called patient and she would like a referral to:  Dr. Tomasita Morrow [facility] [address] [place], [place] [address] [phone]  She has family there and support for she and her children.  She states that she is ready now for a hysterectomy if need be.  Glennie Hawk, CMA

## 2023-07-05 NOTE — Telephone Encounter (Signed)
-----   Message from Denny Levy sent at 07/05/2023  1:53 PM EDT ----- [facility] team  I sent a my chart message to this patient with her results. Can you call her and see if she wants me to go ahead and do the referral to GYN as we had talked about?  Here I the message I sent her:" The endometrial ultrasound showed what we expected, a lining that is disordered and thickened. These are  the same findings of 2021, so no huge change which is reassuring. I would recommend referral to GYN, for further evaluation and possible hysterectomy as we discussed. I will see if my nurse can call you as I am out of the office this week.

## 2023-07-06 ENCOUNTER — Encounter: Payer: Self-pay | Admitting: Family Medicine

## 2023-07-06 ENCOUNTER — Other Ambulatory Visit: Payer: Self-pay | Admitting: Family Medicine

## 2023-07-06 DIAGNOSIS — N939 Abnormal uterine and vaginal bleeding, unspecified: Secondary | ICD-10-CM

## 2023-07-06 DIAGNOSIS — R102 Pelvic and perineal pain: Secondary | ICD-10-CM

## 2023-07-07 NOTE — Addendum Note (Signed)
Addended by: Darral Dash on: 07/07/2023 05:04 PM   Modules accepted: Orders

## 2023-07-11 NOTE — Progress Notes (Signed)
Referred to provider of her choice and sent referral/summary letter as well

## 2023-07-14 ENCOUNTER — Telehealth: Payer: Self-pay | Admitting: Pulmonary Disease

## 2023-07-14 DIAGNOSIS — J45998 Other asthma: Secondary | ICD-10-CM

## 2023-07-14 NOTE — Telephone Encounter (Signed)
PT has moved and would like [place] to send a referral to one of her local Pulmonologist's.  Now in [place], [place]  Any Dr. Naoma Diener with [facility] in the [place], [place] area is fine.  Her # is [phone]  New Address: [address] [place], [place] [address]

## 2023-07-19 NOTE — Telephone Encounter (Signed)
Referral placed.

## 2023-07-21 NOTE — Telephone Encounter (Signed)
Order has been faxed

## 2023-12-17 ENCOUNTER — Other Ambulatory Visit (HOSPITAL_BASED_OUTPATIENT_CLINIC_OR_DEPARTMENT_OTHER): Payer: Self-pay | Admitting: Pulmonary Disease

## 2024-03-14 NOTE — Telephone Encounter (Signed)
error 

## 2024-05-29 ENCOUNTER — Encounter: Payer: Self-pay | Admitting: *Deleted
